# Patient Record
Sex: Male | Born: 1945
Health system: Southern US, Community
[De-identification: ages and names within clinical notes are randomized; demographics above are authoritative.]

## PROBLEM LIST (undated history)

## (undated) DIAGNOSIS — K5790 Diverticulosis of intestine, part unspecified, without perforation or abscess without bleeding: Secondary | ICD-10-CM

## (undated) DIAGNOSIS — E669 Obesity, unspecified: Secondary | ICD-10-CM

## (undated) DIAGNOSIS — I10 Essential (primary) hypertension: Secondary | ICD-10-CM

## (undated) DIAGNOSIS — N529 Male erectile dysfunction, unspecified: Secondary | ICD-10-CM

## (undated) DIAGNOSIS — E039 Hypothyroidism, unspecified: Secondary | ICD-10-CM

## (undated) DIAGNOSIS — K635 Polyp of colon: Secondary | ICD-10-CM

## (undated) HISTORY — DX: Obesity, unspecified: E66.9

## (undated) HISTORY — DX: Essential (primary) hypertension: I10

## (undated) HISTORY — DX: Polyp of colon: K63.5

## (undated) HISTORY — DX: Hypothyroidism, unspecified: E03.9

## (undated) HISTORY — PX: CATARACT EXTRACTION, BILATERAL: SHX1313

## (undated) HISTORY — DX: Diverticulosis of intestine, part unspecified, without perforation or abscess without bleeding: K57.90

## (undated) HISTORY — PX: CHOLECYSTECTOMY: SHX55

## (undated) HISTORY — DX: Male erectile dysfunction, unspecified: N52.9

---

## 2006-03-06 ENCOUNTER — Encounter: Payer: Self-pay | Admitting: Family Medicine

## 2006-03-06 LAB — CONVERTED CEMR LAB

## 2006-03-15 ENCOUNTER — Ambulatory Visit: Payer: Self-pay | Admitting: Family Medicine

## 2006-03-21 ENCOUNTER — Ambulatory Visit: Payer: Self-pay | Admitting: Family Medicine

## 2006-04-11 ENCOUNTER — Ambulatory Visit: Payer: Self-pay | Admitting: Internal Medicine

## 2006-04-27 ENCOUNTER — Ambulatory Visit: Payer: Self-pay | Admitting: Family Medicine

## 2006-06-16 ENCOUNTER — Ambulatory Visit: Payer: Self-pay | Admitting: Family Medicine

## 2006-12-28 ENCOUNTER — Ambulatory Visit: Payer: Self-pay

## 2006-12-28 ENCOUNTER — Encounter: Payer: Self-pay | Admitting: Cardiovascular Disease

## 2007-01-26 ENCOUNTER — Ambulatory Visit: Payer: Self-pay | Admitting: Family Medicine

## 2007-02-13 ENCOUNTER — Ambulatory Visit: Payer: Self-pay | Admitting: Family Medicine

## 2007-08-16 ENCOUNTER — Encounter: Payer: Self-pay | Admitting: Family Medicine

## 2007-08-16 DIAGNOSIS — K219 Gastro-esophageal reflux disease without esophagitis: Secondary | ICD-10-CM | POA: Insufficient documentation

## 2007-08-16 DIAGNOSIS — E669 Obesity, unspecified: Secondary | ICD-10-CM | POA: Insufficient documentation

## 2007-08-16 DIAGNOSIS — I10 Essential (primary) hypertension: Secondary | ICD-10-CM | POA: Insufficient documentation

## 2008-02-27 ENCOUNTER — Ambulatory Visit: Payer: Self-pay | Admitting: Family Medicine

## 2008-02-27 LAB — CONVERTED CEMR LAB
ALT: 40 units/L (ref 0–53)
Basophils Absolute: 0.1 10*3/uL (ref 0.0–0.1)
Basophils Relative: 1.3 % — ABNORMAL HIGH (ref 0.0–1.0)
CO2: 33 meq/L — ABNORMAL HIGH (ref 19–32)
Calcium: 9.6 mg/dL (ref 8.4–10.5)
Cholesterol: 188 mg/dL (ref 0–200)
Creatinine, Ser: 1 mg/dL (ref 0.4–1.5)
Glucose, Bld: 100 mg/dL — ABNORMAL HIGH (ref 70–99)
Glucose, Urine, Semiquant: NEGATIVE
HCT: 46.2 % (ref 39.0–52.0)
Hemoglobin: 15.8 g/dL (ref 13.0–17.0)
LDL Cholesterol: 115 mg/dL — ABNORMAL HIGH (ref 0–99)
Lymphocytes Relative: 28.6 % (ref 12.0–46.0)
MCHC: 34.2 g/dL (ref 30.0–36.0)
Monocytes Relative: 9.3 % (ref 3.0–11.0)
Neutro Abs: 3 10*3/uL (ref 1.4–7.7)
PSA: 1.39 ng/mL (ref 0.10–4.00)
RBC: 5.01 M/uL (ref 4.22–5.81)
Specific Gravity, Urine: 1.015
TSH: 3.44 microintl units/mL (ref 0.35–5.50)
Total Protein: 6.9 g/dL (ref 6.0–8.3)
Urobilinogen, UA: 1

## 2008-04-12 ENCOUNTER — Ambulatory Visit: Payer: Self-pay | Admitting: Family Medicine

## 2008-04-12 DIAGNOSIS — L290 Pruritus ani: Secondary | ICD-10-CM | POA: Insufficient documentation

## 2008-04-12 DIAGNOSIS — H9319 Tinnitus, unspecified ear: Secondary | ICD-10-CM | POA: Insufficient documentation

## 2008-05-06 ENCOUNTER — Ambulatory Visit: Payer: Self-pay | Admitting: Internal Medicine

## 2008-05-20 ENCOUNTER — Ambulatory Visit: Payer: Self-pay | Admitting: Internal Medicine

## 2008-05-20 ENCOUNTER — Encounter: Payer: Self-pay | Admitting: Internal Medicine

## 2008-05-20 LAB — HM COLONOSCOPY

## 2008-05-21 ENCOUNTER — Encounter: Payer: Self-pay | Admitting: Internal Medicine

## 2009-06-12 ENCOUNTER — Telehealth: Payer: Self-pay | Admitting: Family Medicine

## 2009-06-12 ENCOUNTER — Ambulatory Visit: Payer: Self-pay | Admitting: Internal Medicine

## 2009-06-17 LAB — CONVERTED CEMR LAB
AST: 24 units/L (ref 0–37)
Albumin: 3.8 g/dL (ref 3.5–5.2)
Basophils Absolute: 0.1 10*3/uL (ref 0.0–0.1)
Bilirubin Urine: NEGATIVE
CO2: 31 meq/L (ref 19–32)
GFR calc non Af Amer: 80.73 mL/min (ref >60)
Glucose, Bld: 94 mg/dL (ref 70–99)
HCT: 43 % (ref 39.0–52.0)
Hemoglobin, Urine: NEGATIVE
Hemoglobin: 14.9 g/dL (ref 13.0–17.0)
Ketones, ur: NEGATIVE mg/dL
Leukocytes, UA: NEGATIVE
Lymphs Abs: 2.1 10*3/uL (ref 0.7–4.0)
MCHC: 34.8 g/dL (ref 30.0–36.0)
Monocytes Relative: 10.9 % (ref 3.0–12.0)
Neutro Abs: 2.5 10*3/uL (ref 1.4–7.7)
Potassium: 3.9 meq/L (ref 3.5–5.1)
RDW: 12.1 % (ref 11.5–14.6)
Sodium: 142 meq/L (ref 135–145)
Specific Gravity, Urine: >=1.03 (ref 1.000–1.030)
TSH: 10.52 microintl units/mL — ABNORMAL HIGH (ref 0.35–5.50)
Total Protein: 7 g/dL (ref 6.0–8.3)
Urobilinogen, UA: 0.2 (ref 0.0–1.0)

## 2009-06-19 ENCOUNTER — Ambulatory Visit: Payer: Self-pay | Admitting: Family Medicine

## 2009-06-19 DIAGNOSIS — E039 Hypothyroidism, unspecified: Secondary | ICD-10-CM | POA: Insufficient documentation

## 2009-07-31 ENCOUNTER — Ambulatory Visit: Payer: Self-pay | Admitting: Family Medicine

## 2009-08-01 ENCOUNTER — Telehealth: Payer: Self-pay | Admitting: Family Medicine

## 2009-08-01 LAB — CONVERTED CEMR LAB: TSH: 5.29 microintl units/mL (ref 0.35–5.50)

## 2010-07-09 ENCOUNTER — Ambulatory Visit: Payer: Self-pay | Admitting: Family Medicine

## 2010-07-09 LAB — CONVERTED CEMR LAB
AST: 26 units/L (ref 0–37)
Albumin: 4 g/dL (ref 3.5–5.2)
Alkaline Phosphatase: 53 units/L (ref 39–117)
Basophils Absolute: 0.1 10*3/uL (ref 0.0–0.1)
Basophils Relative: 1.3 % (ref 0.0–3.0)
Bilirubin, Direct: 0.2 mg/dL (ref 0.0–0.3)
Blood in Urine, dipstick: NEGATIVE
Calcium: 9.4 mg/dL (ref 8.4–10.5)
Creatinine, Ser: 1 mg/dL (ref 0.4–1.5)
GFR calc non Af Amer: 83.33 mL/min (ref >60)
HDL: 46 mg/dL (ref >39.00)
Hemoglobin: 15.4 g/dL (ref 13.0–17.0)
LDL Cholesterol: 99 mg/dL (ref 0–99)
Lymphocytes Relative: 35.3 % (ref 12.0–46.0)
Monocytes Relative: 10.2 % (ref 3.0–12.0)
Neutro Abs: 2.6 10*3/uL (ref 1.4–7.7)
Neutrophils Relative %: 47.7 % (ref 43.0–77.0)
Nitrite: NEGATIVE
Protein, U semiquant: NEGATIVE
RBC: 4.66 M/uL (ref 4.22–5.81)
RDW: 12.9 % (ref 11.5–14.6)
Sodium: 139 meq/L (ref 135–145)
Total CHOL/HDL Ratio: 4
Triglycerides: 108 mg/dL (ref 0.0–149.0)
Urobilinogen, UA: 0.2
VLDL: 21.6 mg/dL (ref 0.0–40.0)
WBC Urine, dipstick: NEGATIVE

## 2010-08-03 ENCOUNTER — Ambulatory Visit: Payer: Self-pay | Admitting: Family Medicine

## 2010-08-03 DIAGNOSIS — R6882 Decreased libido: Secondary | ICD-10-CM | POA: Insufficient documentation

## 2010-08-03 LAB — CONVERTED CEMR LAB
Folate: 13.5 ng/mL
Vitamin B-12: 606 pg/mL (ref 211–911)

## 2011-01-05 NOTE — Assessment & Plan Note (Signed)
Summary: CPX // RS----PT [facility] // RS   Vital Signs:  Patient profile:   65 year old male Height:      70.5 inches Weight:      238 pounds BMI:     33.79 Temp:     98.2 degrees F oral BP sitting:   120 / 84  (left arm) Cuff size:   regular  Vitals Entered By: Kern Reap CMA Duncan Dull) (August 03, 2010 10:40 AM) CC: cpx Is Patient Diabetic? No Pain Assessment Patient in pain? no        CC:  cpx.  History of Present Illness: Edward Miles is a 65 year old male, who comes in today for general physical examination and to discuss hypertension, hypothyroidism, decreased libido.  His hypertension is treated with Tenoretic 50 -- 25 dose one half tab daily and 81-mg baby aspirin.  BP 120/84.  His hypothyroidism is treated with Synthroid 50 micrograms daily.  Will check TSH level.  He does have a history of eczema for which he uses Lidex .025 cream p.r.n.  His wife has commented that his libido is low.  We discussed the differential diagnosis.  He would like a testosterone level and to start on V........Marland Kitchen  If this does not resolve the issue then would consider changing his beta-blocker to another medication.  Tetanus booster 2007, yearly eye exam, normal dental exams, colonoscopy 3 years ago, one polyp recall 3 years.  He and his wife continue to care for their grand child.  Nightly.  Her daughter works EMS at night, and a Biomedical engineer and then take her to school in the morning than mamma picks her up after school  Allergies: No Known Drug Allergies  Past History:  Past medical, surgical, family and social histories (including risk factors) reviewed, and no changes noted (except as noted below).  Past Medical History: Reviewed history from 08/16/2007 and no changes required. GERD Hypertension obese  Past Surgical History: Reviewed history from 08/16/2007 and no changes required. Cholecystectomy  Family History: Reviewed history from 04/12/2008 and no changes required.  doesn't know  anything about his father's healthmother died of congestive heart failure, underlying hypertension no brothers one sister had Hodgkin's lymphoma  Social History: Reviewed history from 04/12/2008 and no changes required. Occupation: Married Never Smoked Alcohol use-no Drug use-no Regular exercise-no  Review of Systems      See HPI  Physical Exam  General:  Well-developed,well-nourished,in no acute distress; alert,appropriate and cooperative throughout examination Head:  Normocephalic and atraumatic without obvious abnormalities. No apparent alopecia or balding. Eyes:  No corneal or conjunctival inflammation noted. EOMI. Perrla. Funduscopic exam benign, without hemorrhages, exudates or papilledema. Vision grossly normal. Ears:  External ear exam shows no significant lesions or deformities.  Otoscopic examination reveals clear canals, tympanic membranes are intact bilaterally without bulging, retraction, inflammation or discharge. Hearing is grossly normal bilaterally. Nose:  External nasal examination shows no deformity or inflammation. Nasal mucosa are pink and moist without lesions or exudates. Mouth:  Oral mucosa and oropharynx without lesions or exudates.  Teeth in good repair. Neck:  No deformities, masses, or tenderness noted. Chest Wall:  No deformities, masses, tenderness or gynecomastia noted. Breasts:  No masses or gynecomastia noted Lungs:  Normal respiratory effort, chest expands symmetrically. Lungs are clear to auscultation, no crackles or wheezes. Heart:  Normal rate and regular rhythm. S1 and S2 normal without gallop, murmur, click, rub or other extra sounds. Abdomen:  Bowel sounds positive,abdomen soft and non-tender without masses, organomegaly or hernias noted. Rectal:  No external abnormalities noted. Normal sphincter tone. No rectal masses or tenderness. Genitalia:  Testes bilaterally descended without nodularity, tenderness or masses. No scrotal masses or lesions.  No penis lesions or urethral discharge. Prostate:  Prostate gland firm and smooth, no enlargement, nodularity, tenderness, mass, asymmetry or induration. Msk:  No deformity or scoliosis noted of thoracic or lumbar spine.   Pulses:  R and L carotid,radial,femoral,dorsalis pedis and posterior tibial pulses are full and equal bilaterally Extremities:  No clubbing, cyanosis, edema, or deformity noted with normal full range of motion of all joints.   Neurologic:  No cranial nerve deficits noted. Station and gait are normal. Plantar reflexes are down-going bilaterally. DTRs are symmetrical throughout. Sensory, motor and coordinative functions appear intact. Skin:  Intact without suspicious lesions or rashes Cervical Nodes:  No lymphadenopathy noted Axillary Nodes:  No palpable lymphadenopathy Inguinal Nodes:  No significant adenopathy Psych:  Cognition and judgment appear intact. Alert and cooperative with normal attention span and concentration. No apparent delusions, illusions, hallucinations   Impression & Recommendations:  Problem # 1:  UNSPECIFIED HYPOTHYROIDISM (ICD-244.9) Assessment Improved  His updated medication list for this problem includes:    Synthroid 50 Mcg Tabs (Levothyroxine sodium) .Marland Kitchen... Take 1 tablet by mouth every morning  Problem # 2:  OBESITY (ICD-278.00) Assessment: Unchanged  Orders: EKG w/ Interpretation (93000)  Problem # 3:  HYPERTENSION (ICD-401.9) Assessment: Improved  His updated medication list for this problem includes:    Tenoretic 50 50-25 Mg Tabs (Atenolol-chlorthalidone) .Marland Kitchen... 1/2 daily  Orders: EKG w/ Interpretation (93000)  Problem # 4:  LIBIDO, DECREASED (ICD-799.81) Assessment: New  Orders: Venipuncture (54098) TLB-B12 + Folate Pnl (11914_78295-A21/HYQ) TLB-Testosterone, Total (84403-TESTO) Specimen Handling (65784)  Complete Medication List: 1)  Aspirin 81 Mg Tbec (Aspirin) .... Once daily 2)  Tenoretic 50 50-25 Mg Tabs  (Atenolol-chlorthalidone) .... 1/2 daily 3)  Lidex 0.05 % Crea (Fluocinonide) .... Apply at bedtime 4)  Synthroid 50 Mcg Tabs (Levothyroxine sodium) .... Take 1 tablet by mouth every morning 5)  Viagra 50 Mg Tabs (Sildenafil citrate) .... Uad  Other Orders: Admin 1st Vaccine (69629) Flu Vaccine 75yrs + [phone])  Patient Instructions: 1)  I will call you when I get your lab work back. 2)  In the meantime, Viagra 50 mg directions one half tab with water, one to two hours prior to sex. 3)  Please schedule a follow-up appointment in 1 year. 4)  Please schedule a follow-up appointment as needed. 5)  It is important that you exercise regularly at least 20 minutes 5 times a week. If you develop chest pain, have severe difficulty breathing, or feel very tired , stop exercising immediately and seek medical attention. 6)  Schedule a colonoscopy/sigmoidoscopy to help detect colon cancer. 7)  Take an Aspirin every day. Prescriptions: SYNTHROID 50 MCG TABS (LEVOTHYROXINE SODIUM) Take 1 tablet by mouth every morning  #100 x 3   Entered and Authorized by:   Roderick Pee MD   Signed by:   Roderick Pee MD on 08/03/2010   Method used:   Electronically to        [id number]* (retail)       [address]       [place], [place]  [address]       Ph: [phone]       Fax: [phone]   RxID:   [id number] LIDEX 0.05 %  CREA (FLUOCINONIDE) apply at bedtime  #  60 gr x 4   Entered and Authorized by:   Roderick Pee MD   Signed by:   Roderick Pee MD on 08/03/2010   Method used:   Electronically to        [id number]* (retail)       [address]       [place], [place]  [address]       Ph: [phone]       Fax: [phone]   RxID:   [id number] TENORETIC 50 50-25 MG  TABS (ATENOLOL-CHLORTHALIDONE) 1/2 daily  #100 Each x 2   Entered and Authorized by:   Roderick Pee MD   Signed by:    Roderick Pee MD on 08/03/2010   Method used:   Electronically to        [id number]* (retail)       [address]       [place], [place]  [address]       Ph: [phone]       Fax: [phone]   RxID:   [id number] VIAGRA 50 MG TABS (SILDENAFIL CITRATE) UAD  #6 x 11   Entered and Authorized by:   Roderick Pee MD   Signed by:   Roderick Pee MD on 08/03/2010   Method used:   Electronically to        [id number]* (retail)       [address]       [place], [place]  [address]       Ph: [phone]       Fax: [phone]   RxID:   [id number]  Flu Vaccine Consent Questions     Do you have a history of severe allergic reactions to this vaccine? no    Any prior history of allergic reactions to egg and/or gelatin? no    Do you have a sensitivity to the preservative Thimersol? no    Do you have a past history of Guillan-Barre Syndrome? no    Do you currently have an acute febrile illness? no    Have you ever had a severe reaction to latex? no    Vaccine information given and explained to patient? yes    Are you currently pregnant? no    Lot Number:[id number]   Exp Date:06/05/2011   Site Given  Left Deltoid IM       [place], [place]  [address]       Ph: [phone]       Fax: [phone]   RxID:   [id number]  .lbflu

## 2011-07-22 ENCOUNTER — Telehealth: Payer: Self-pay | Admitting: Family Medicine

## 2011-07-22 MED ORDER — LEVOTHYROXINE SODIUM 50 MCG PO TABS: 50.0000 ug | ORAL_TABLET | Freq: Every day | ORAL | Status: DC

## 2011-09-06 ENCOUNTER — Other Ambulatory Visit (INDEPENDENT_AMBULATORY_CARE_PROVIDER_SITE_OTHER): Payer: BC Managed Care – PPO

## 2011-09-06 DIAGNOSIS — Z0000 Encounter for general adult medical examination without abnormal findings: Secondary | ICD-10-CM

## 2011-09-06 LAB — CBC WITH DIFFERENTIAL/PLATELET
Basophils Absolute: 0 10*3/uL (ref 0.0–0.1)
Eosinophils Relative: 3.9 % (ref 0.0–5.0)
Hemoglobin: 15.1 g/dL (ref 13.0–17.0)
Lymphocytes Relative: 31.5 % (ref 12.0–46.0)
Monocytes Relative: 10.3 % (ref 3.0–12.0)
Platelets: 170 10*3/uL (ref 150.0–400.0)
RDW: 13 % (ref 11.5–14.6)
WBC: 5.1 10*3/uL (ref 4.5–10.5)

## 2011-09-06 LAB — HEPATIC FUNCTION PANEL
ALT: 31 U/L (ref 0–53)
Albumin: 4.2 g/dL (ref 3.5–5.2)
Total Protein: 7 g/dL (ref 6.0–8.3)

## 2011-09-06 LAB — POCT URINALYSIS DIPSTICK
Bilirubin, UA: NEGATIVE
Ketones, UA: NEGATIVE
Leukocytes, UA: NEGATIVE
Spec Grav, UA: 1.02
pH, UA: 8.5

## 2011-09-06 LAB — BASIC METABOLIC PANEL
BUN: 19 mg/dL (ref 6–23)
Calcium: 9.1 mg/dL (ref 8.4–10.5)
GFR: 95.38 mL/min (ref >60.00)
Glucose, Bld: 94 mg/dL (ref 70–99)
Potassium: 4.1 mEq/L (ref 3.5–5.1)
Sodium: 139 mEq/L (ref 135–145)

## 2011-09-06 LAB — LIPID PANEL
Cholesterol: 170 mg/dL (ref 0–200)
HDL: 58.2 mg/dL (ref >39.00)
LDL Cholesterol: 95 mg/dL (ref 0–99)
VLDL: 17 mg/dL (ref 0.0–40.0)

## 2011-09-07 ENCOUNTER — Other Ambulatory Visit: Payer: Self-pay

## 2011-09-14 ENCOUNTER — Encounter: Payer: Self-pay | Admitting: Family Medicine

## 2011-09-14 ENCOUNTER — Ambulatory Visit (INDEPENDENT_AMBULATORY_CARE_PROVIDER_SITE_OTHER): Payer: BC Managed Care – PPO | Admitting: Family Medicine

## 2011-09-14 DIAGNOSIS — Z23 Encounter for immunization: Secondary | ICD-10-CM

## 2011-09-14 DIAGNOSIS — I10 Essential (primary) hypertension: Secondary | ICD-10-CM

## 2011-09-14 DIAGNOSIS — Z0000 Encounter for general adult medical examination without abnormal findings: Secondary | ICD-10-CM

## 2011-09-14 DIAGNOSIS — E039 Hypothyroidism, unspecified: Secondary | ICD-10-CM

## 2011-09-14 MED ORDER — LEVOTHYROXINE SODIUM 50 MCG PO TABS: 50.0000 ug | ORAL_TABLET | Freq: Every day | ORAL | Status: DC

## 2011-09-14 NOTE — Patient Instructions (Signed)
Continue your current medications.  Follow-up in one year, sooner if any problems 

## 2011-09-14 NOTE — Progress Notes (Signed)
  Subjective:    Patient ID: Edward Miles, male    DOB: 06-Apr-1946, 65 y.o.   MRN: [id number]  HPI This is a 65 year old, married male, nonsmoker, who comes in today for general physical examination because of a history of hypertension, eczema, and hypothyroidism.  His medications are unchanged.  He feels well and has no complaints.  He's not had to use the Viagra   Review of Systems  Constitutional: Negative.   HENT: Negative.   Eyes: Negative.   Respiratory: Negative.   Cardiovascular: Negative.   Gastrointestinal: Negative.   Genitourinary: Negative.   Musculoskeletal: Negative.   Skin: Negative.   Neurological: Negative.   Hematological: Negative.   Psychiatric/Behavioral: Negative.        Objective:   Physical Exam  Constitutional: He is oriented to person, place, and time. He appears well-developed and well-nourished.  HENT:  Head: Normocephalic and atraumatic.  Right Ear: External ear normal.  Left Ear: External ear normal.  Nose: Nose normal.  Mouth/Throat: Oropharynx is clear and moist.  Eyes: Conjunctivae and EOM are normal. Pupils are equal, round, and reactive to light.  Neck: Normal range of motion. Neck supple. No JVD present. No tracheal deviation present. No thyromegaly present.  Cardiovascular: Normal rate, regular rhythm, normal heart sounds and intact distal pulses.  Exam reveals no gallop and no friction rub.   No murmur heard. Pulmonary/Chest: Effort normal and breath sounds normal. No stridor. No respiratory distress. He has no wheezes. He has no rales. He exhibits no tenderness.  Abdominal: Soft. Bowel sounds are normal. He exhibits no distension and no mass. There is no tenderness. There is no rebound and no guarding.  Genitourinary: Rectum normal, prostate normal and penis normal. Guaiac negative stool. No penile tenderness.  Musculoskeletal: Normal range of motion. He exhibits no edema and no tenderness.  Lymphadenopathy:    He has no  cervical adenopathy.  Neurological: He is alert and oriented to person, place, and time. He has normal reflexes. No cranial nerve deficit. He exhibits normal muscle tone.  Skin: Skin is warm and dry. No rash noted. No erythema. No pallor.  Psychiatric: He has a normal mood and affect. His behavior is normal. Judgment and thought content normal.          Assessment & Plan:  Healthy male.  Hypertension.  Continue Tenoretic one half tab daily and an aspirin tablet.  Eczema.  Continue Lidex p.r.n.  Hypothyroidism.  Continue Synthroid 50 mcg daily

## 2011-09-16 ENCOUNTER — Telehealth: Payer: Self-pay

## 2011-09-16 MED ORDER — ATENOLOL-CHLORTHALIDONE 50-25 MG PO TABS: ORAL_TABLET | ORAL | Status: DC

## 2011-09-16 MED ORDER — FLUOCINONIDE 0.05 % EX CREA: 1.0000 "application " | TOPICAL_CREAM | Freq: Every day | CUTANEOUS | Status: DC

## 2011-09-16 NOTE — Telephone Encounter (Signed)
rx sent in to pharmacy.  Ok per Dr. Tawanna Cooler

## 2012-09-17 ENCOUNTER — Other Ambulatory Visit: Payer: Self-pay | Admitting: Family Medicine

## 2012-11-10 ENCOUNTER — Other Ambulatory Visit (INDEPENDENT_AMBULATORY_CARE_PROVIDER_SITE_OTHER): Payer: BC Managed Care – PPO

## 2012-11-10 DIAGNOSIS — Z0000 Encounter for general adult medical examination without abnormal findings: Secondary | ICD-10-CM

## 2012-11-10 LAB — BASIC METABOLIC PANEL
Calcium: 9.2 mg/dL (ref 8.4–10.5)
GFR: 91.34 mL/min (ref >60.00)
Glucose, Bld: 99 mg/dL (ref 70–99)
Sodium: 138 mEq/L (ref 135–145)

## 2012-11-10 LAB — CBC WITH DIFFERENTIAL/PLATELET
Basophils Absolute: 0.1 10*3/uL (ref 0.0–0.1)
HCT: 43.9 % (ref 39.0–52.0)
Lymphs Abs: 1.7 10*3/uL (ref 0.7–4.0)
MCHC: 34.3 g/dL (ref 30.0–36.0)
MCV: 92.9 fl (ref 78.0–100.0)
Monocytes Absolute: 0.5 10*3/uL (ref 0.1–1.0)
Platelets: 174 10*3/uL (ref 150.0–400.0)
RDW: 12.7 % (ref 11.5–14.6)

## 2012-11-10 LAB — POCT URINALYSIS DIPSTICK
Bilirubin, UA: NEGATIVE
Ketones, UA: NEGATIVE
Leukocytes, UA: NEGATIVE
pH, UA: 7

## 2012-11-10 LAB — LIPID PANEL
HDL: 57.3 mg/dL (ref >39.00)
Triglycerides: 115 mg/dL (ref 0.0–149.0)

## 2012-11-10 LAB — TSH: TSH: 3.29 u[IU]/mL (ref 0.35–5.50)

## 2012-11-10 LAB — HEPATIC FUNCTION PANEL
ALT: 29 U/L (ref 0–53)
AST: 23 U/L (ref 0–37)
Bilirubin, Direct: 0.2 mg/dL (ref 0.0–0.3)
Total Bilirubin: 1 mg/dL (ref 0.3–1.2)
Total Protein: 6.9 g/dL (ref 6.0–8.3)

## 2012-11-20 ENCOUNTER — Encounter: Payer: Self-pay | Admitting: Family Medicine

## 2012-11-20 ENCOUNTER — Ambulatory Visit (INDEPENDENT_AMBULATORY_CARE_PROVIDER_SITE_OTHER): Payer: BC Managed Care – PPO | Admitting: Family Medicine

## 2012-11-20 VITALS — BP 110/80 | Temp 98.1°F | Ht 71.0 in | Wt 239.0 lb

## 2012-11-20 DIAGNOSIS — Z23 Encounter for immunization: Secondary | ICD-10-CM

## 2012-11-20 DIAGNOSIS — I10 Essential (primary) hypertension: Secondary | ICD-10-CM

## 2012-11-20 DIAGNOSIS — E039 Hypothyroidism, unspecified: Secondary | ICD-10-CM

## 2012-11-20 MED ORDER — ATENOLOL-CHLORTHALIDONE 50-25 MG PO TABS: ORAL_TABLET | ORAL | Status: DC

## 2012-11-20 MED ORDER — LEVOTHYROXINE SODIUM 50 MCG PO TABS: 50.0000 ug | ORAL_TABLET | Freq: Every day | ORAL | Status: DC

## 2012-11-20 NOTE — Patient Instructions (Signed)
Continue your good health habits  Remember to walk daily  Followup in 1 year sooner if any problems

## 2012-11-20 NOTE — Progress Notes (Signed)
  Subjective:    Patient ID: Edward Miles, male    DOB: June 21, 1946, 66 y.o.   MRN: [id number]  HPI Edward Miles is a 66 year old married male nonsmoker who comes in today for general physical examination because of a history of hypertension, eczema, and hypothyroidism  He takes Tenoretic 50-25 dose one half tab daily BP 110/80  He takes Synthroid one daily for hypothyroidism TSH normal  He gets routine eye care, dental care, colonoscopy about 6 years ago he was totally polyp. Asked him to call GI since they haven't called him to do fine when he needs to come back for followup  Tetanus 2007, shingles 2012,   Review of Systems  Constitutional: Negative.   HENT: Negative.   Eyes: Negative.   Respiratory: Negative.   Cardiovascular: Negative.   Gastrointestinal: Negative.   Genitourinary: Negative.   Musculoskeletal: Negative.   Skin: Negative.   Neurological: Negative.   Hematological: Negative.   Psychiatric/Behavioral: Negative.        Objective:   Physical Exam  Constitutional: He is oriented to person, place, and time. He appears well-developed and well-nourished.  HENT:  Head: Normocephalic and atraumatic.  Right Ear: External ear normal.  Left Ear: External ear normal.  Nose: Nose normal.  Mouth/Throat: Oropharynx is clear and moist.  Eyes: Conjunctivae normal and EOM are normal. Pupils are equal, round, and reactive to light.  Neck: Normal range of motion. Neck supple. No JVD present. No tracheal deviation present. No thyromegaly present.  Cardiovascular: Normal rate, regular rhythm, normal heart sounds and intact distal pulses.  Exam reveals no gallop and no friction rub.   No murmur heard. Pulmonary/Chest: Effort normal and breath sounds normal. No stridor. No respiratory distress. He has no wheezes. He has no rales. He exhibits no tenderness.  Abdominal: Soft. Bowel sounds are normal. He exhibits no distension and no mass. There is no tenderness. There is no rebound  and no guarding.  Genitourinary: Rectum normal, prostate normal and penis normal. Guaiac negative stool. No penile tenderness.  Musculoskeletal: Normal range of motion. He exhibits no edema and no tenderness.  Lymphadenopathy:    He has no cervical adenopathy.  Neurological: He is alert and oriented to person, place, and time. He has normal reflexes. No cranial nerve deficit. He exhibits normal muscle tone.  Skin: Skin is warm and dry. No rash noted. No erythema. No pallor.  Psychiatric: He has a normal mood and affect. His behavior is normal. Judgment and thought content normal.          Assessment & Plan:  Healthy male  Hypertension continue Tenoretic dose one half tab daily  Hypothyroidism continue Synthroid 50 mcg daily  Return in one year to sooner if any problems flu shot and cardiogram today

## 2012-11-21 DIAGNOSIS — Z23 Encounter for immunization: Secondary | ICD-10-CM

## 2013-01-10 ENCOUNTER — Other Ambulatory Visit: Payer: Self-pay | Admitting: *Deleted

## 2013-01-10 MED ORDER — SILDENAFIL CITRATE 50 MG PO TABS: 50.0000 mg | ORAL_TABLET | Freq: Every day | ORAL | Status: DC | PRN

## 2013-06-20 ENCOUNTER — Encounter: Payer: Self-pay | Admitting: Internal Medicine

## 2013-06-28 ENCOUNTER — Other Ambulatory Visit: Payer: Self-pay

## 2013-06-28 DIAGNOSIS — I10 Essential (primary) hypertension: Secondary | ICD-10-CM

## 2013-06-28 DIAGNOSIS — E039 Hypothyroidism, unspecified: Secondary | ICD-10-CM

## 2013-06-28 MED ORDER — LEVOTHYROXINE SODIUM 50 MCG PO TABS: 50.0000 ug | ORAL_TABLET | Freq: Every day | ORAL | Status: DC

## 2013-06-28 MED ORDER — ATENOLOL-CHLORTHALIDONE 50-25 MG PO TABS: ORAL_TABLET | ORAL | Status: DC

## 2013-06-28 NOTE — Telephone Encounter (Signed)
Rx request for levothyroixne and tenolol/chlor 50/25. Rx sent to [facility].

## 2013-07-25 ENCOUNTER — Encounter: Payer: Self-pay | Admitting: Internal Medicine

## 2013-07-25 ENCOUNTER — Ambulatory Visit (INDEPENDENT_AMBULATORY_CARE_PROVIDER_SITE_OTHER): Payer: Medicare Other | Admitting: Internal Medicine

## 2013-07-25 VITALS — BP 140/80 | HR 97 | Temp 98.7°F | Wt 246.0 lb

## 2013-07-25 DIAGNOSIS — I10 Essential (primary) hypertension: Secondary | ICD-10-CM

## 2013-07-25 DIAGNOSIS — E039 Hypothyroidism, unspecified: Secondary | ICD-10-CM

## 2013-07-25 NOTE — Progress Notes (Signed)
  Subjective:    Patient ID: Edward Miles, male    DOB: [birth year], 67 y.o.   MRN: [id number]  HPI  Wt Readings from Last 3 Encounters:  07/25/13 246 lb (111.585 kg)  11/20/12 239 lb (108.41 kg)  09/14/11 227 lb (102.967 kg)    Review of Systems     Objective:   Physical Exam        Assessment & Plan:

## 2013-07-25 NOTE — Patient Instructions (Signed)
Call or return to clinic prn if these symptoms worsen or fail to improve as anticipated.

## 2013-07-25 NOTE — Progress Notes (Signed)
Subjective:    Patient ID: Edward Miles, male    DOB: 1946/07/10, 67 y.o.   MRN: [id number]  HPI   67 year old patient who has hypertension and treated hypothyroidism. He presents with a three-day history of a "lump in his throat". He describes a vague sense of fullness in his throat region. He states this is most noticeable he swallows but he denies any true solid food dysphagia.  No weight loss or other constitutional complaints.   lifelong nonsmoker  Past Medical History  Diagnosis Date  . GERD (gastroesophageal reflux disease)   . Hypertension   . Obese     History   Social History  . Marital Status: Married    Spouse Name: N/A    Number of Children: N/A  . Years of Education: N/A   Occupational History  . Not on file.   Social History Main Topics  . Smoking status: Never Smoker   . Smokeless tobacco: Never Used  . Alcohol Use: No  . Drug Use: No  . Sexual Activity: Not on file   Other Topics Concern  . Not on file   Social History Narrative  . No narrative on file    Past Surgical History  Procedure Laterality Date  . Cholecystectomy      Family History  Problem Relation Age of Onset  . Heart failure Mother   . Hypertension Mother   . Hodgkin's lymphoma Sister     No Known Allergies  Current Outpatient Prescriptions on File Prior to Visit  Medication Sig Dispense Refill  . aspirin 81 MG tablet Take 81 mg by mouth daily.        Marland Kitchen atenolol-chlorthalidone (TENORETIC) 50-25 MG per tablet Take 1/2 tablet daily.  100 tablet  1  . fluocinonide (LIDEX) 0.05 % cream Apply 1 application topically at bedtime.  60 g  4  . levothyroxine (SYNTHROID, LEVOTHROID) 50 MCG tablet Take 1 tablet (50 mcg total) by mouth daily.  100 tablet  1  . sildenafil (VIAGRA) 50 MG tablet Take 1 tablet (50 mg total) by mouth daily as needed for erectile dysfunction.  6 tablet  5   No current facility-administered medications on file prior to visit.    BP 140/80  Pulse 97   Temp(Src) 98.7 F (37.1 C) (Oral)  Wt 246 lb (111.585 kg)  BMI 34.33 kg/m2  SpO2 97%     Review of Systems  Constitutional: Negative for fever, chills, appetite change and fatigue.  HENT: Negative for hearing loss, ear pain, congestion, sore throat, trouble swallowing, neck stiffness, dental problem, voice change and tinnitus.   Eyes: Negative for pain, discharge and visual disturbance.  Respiratory: Negative for cough, chest tightness, wheezing and stridor.   Cardiovascular: Negative for chest pain, palpitations and leg swelling.  Gastrointestinal: Negative for nausea, vomiting, abdominal pain, diarrhea, constipation, blood in stool and abdominal distention.  Genitourinary: Negative for urgency, hematuria, flank pain, discharge, difficulty urinating and genital sores.  Musculoskeletal: Negative for myalgias, back pain, joint swelling, arthralgias and gait problem.  Skin: Negative for rash.  Neurological: Negative for dizziness, syncope, speech difficulty, weakness, numbness and headaches.  Hematological: Negative for adenopathy. Does not bruise/bleed easily.  Psychiatric/Behavioral: Negative for behavioral problems and dysphoric mood. The patient is not nervous/anxious.        Objective:   Physical Exam  Constitutional: He appears well-developed and well-nourished.  HENT:  Head: Normocephalic.  Mouth/Throat: Oropharynx is clear and moist. No oropharyngeal exudate.  Neck: Normal range of motion.  No JVD present. No tracheal deviation present. No thyromegaly present.  Pulmonary/Chest: Breath sounds normal.  Abdominal: Soft. Bowel sounds are normal. He exhibits no distension and no mass. There is no tenderness. There is no rebound and no guarding.  Lymphadenopathy:    He has no cervical adenopathy.          Assessment & Plan:  Vague fullness in the throat. Normal clinical exam.  Symptoms have been present only for 3 days. Options discussed. We'll force fluids and treat with  mucolytic sent observe. If symptoms intensify or fail to improve we'll call the office for further evaluation Hypertension repeat blood pressure 140/80  CPX as scheduled in the fall

## 2013-11-14 ENCOUNTER — Other Ambulatory Visit (INDEPENDENT_AMBULATORY_CARE_PROVIDER_SITE_OTHER): Payer: Medicare Other

## 2013-11-14 DIAGNOSIS — Z0000 Encounter for general adult medical examination without abnormal findings: Secondary | ICD-10-CM

## 2013-11-14 DIAGNOSIS — I10 Essential (primary) hypertension: Secondary | ICD-10-CM

## 2013-11-14 DIAGNOSIS — E039 Hypothyroidism, unspecified: Secondary | ICD-10-CM

## 2013-11-14 LAB — CBC WITH DIFFERENTIAL/PLATELET
Basophils Absolute: 0.1 10*3/uL (ref 0.0–0.1)
Eosinophils Relative: 5 % (ref 0.0–5.0)
Lymphocytes Relative: 33.3 % (ref 12.0–46.0)
Lymphs Abs: 2 10*3/uL (ref 0.7–4.0)
Monocytes Relative: 10.8 % (ref 3.0–12.0)
Neutrophils Relative %: 50 % (ref 43.0–77.0)
Platelets: 173 10*3/uL (ref 150.0–400.0)
RDW: 13 % (ref 11.5–14.6)
WBC: 6 10*3/uL (ref 4.5–10.5)

## 2013-11-14 LAB — BASIC METABOLIC PANEL
BUN: 19 mg/dL (ref 6–23)
CO2: 30 mEq/L (ref 19–32)
Calcium: 9.2 mg/dL (ref 8.4–10.5)
Chloride: 100 mEq/L (ref 96–112)
Creatinine, Ser: 1.1 mg/dL (ref 0.4–1.5)
GFR: 70.56 mL/min (ref >60.00)
Glucose, Bld: 89 mg/dL (ref 70–99)
Potassium: 4.1 mEq/L (ref 3.5–5.1)
Sodium: 139 mEq/L (ref 135–145)

## 2013-11-14 LAB — LIPID PANEL
Cholesterol: 182 mg/dL (ref 0–200)
HDL: 61.6 mg/dL (ref >39.00)
LDL Cholesterol: 103 mg/dL — ABNORMAL HIGH (ref 0–99)
Total CHOL/HDL Ratio: 3
VLDL: 17.4 mg/dL (ref 0.0–40.0)

## 2013-11-14 LAB — HEPATIC FUNCTION PANEL
ALT: 27 U/L (ref 0–53)
AST: 23 U/L (ref 0–37)
Bilirubin, Direct: 0.2 mg/dL (ref 0.0–0.3)
Total Bilirubin: 1.2 mg/dL (ref 0.3–1.2)

## 2013-11-14 LAB — POCT URINALYSIS DIPSTICK
Blood, UA: NEGATIVE
Ketones, UA: NEGATIVE
Leukocytes, UA: NEGATIVE
Protein, UA: NEGATIVE
Spec Grav, UA: 1.025
Urobilinogen, UA: 0.2
pH, UA: 7

## 2013-11-14 LAB — TSH: TSH: 4.19 u[IU]/mL (ref 0.35–5.50)

## 2013-11-22 ENCOUNTER — Encounter: Payer: Self-pay | Admitting: Family Medicine

## 2013-11-22 ENCOUNTER — Ambulatory Visit (INDEPENDENT_AMBULATORY_CARE_PROVIDER_SITE_OTHER): Payer: Medicare Other | Admitting: Family Medicine

## 2013-11-22 VITALS — BP 140/90 | Temp 98.8°F | Ht 71.0 in | Wt 244.0 lb

## 2013-11-22 DIAGNOSIS — Z0000 Encounter for general adult medical examination without abnormal findings: Secondary | ICD-10-CM

## 2013-11-22 DIAGNOSIS — K219 Gastro-esophageal reflux disease without esophagitis: Secondary | ICD-10-CM

## 2013-11-22 DIAGNOSIS — E669 Obesity, unspecified: Secondary | ICD-10-CM

## 2013-11-22 DIAGNOSIS — I10 Essential (primary) hypertension: Secondary | ICD-10-CM

## 2013-11-22 DIAGNOSIS — Z23 Encounter for immunization: Secondary | ICD-10-CM

## 2013-11-22 DIAGNOSIS — E039 Hypothyroidism, unspecified: Secondary | ICD-10-CM

## 2013-11-22 MED ORDER — ATENOLOL-CHLORTHALIDONE 50-25 MG PO TABS: ORAL_TABLET | ORAL | Status: DC

## 2013-11-22 MED ORDER — SILDENAFIL CITRATE 100 MG PO TABS: 50.0000 mg | ORAL_TABLET | Freq: Every day | ORAL | Status: DC | PRN

## 2013-11-22 MED ORDER — LEVOTHYROXINE SODIUM 50 MCG PO TABS: 50.0000 ug | ORAL_TABLET | Freq: Every day | ORAL | Status: DC

## 2013-11-22 NOTE — Patient Instructions (Signed)
Continue your current medications  Increase the Viagra 50 mg daily............. one half of 100 mg pill......... [email or web address]  Prilosec 20 mg twice daily  Call GI and leave a voicemail with your doctors nourished that I would recommend you haven't upper endoscopy at the same time the doing a colonoscopy to evaluate her swallowing mechanism  Return in one year sooner if any problems

## 2013-11-22 NOTE — Progress Notes (Signed)
   Subjective:    Patient ID: Edward Miles, male    DOB: [birth year], 67 y.o.   MRN: [id number]  HPI this is a 67 year old married male nonsmoker who comes in today for a Medicare wellness examination  He takes Tenoretic one tablet daily for hypertension BP 140/90  He takes Synthroid 50 mcg daily because of a history of hypothyroidism TSH level is within normal range  He's had a history of reflux esophagitis he's tried different medicines nothing seems to help. Most recently he developed a sensation of something getting stuck in his upper esophagus however he says that food doesn't get stuck he can swallow with no difficulty it's a sensation.  He's due to have his colonoscopy therefore I recommend he get upper endoscopy at the same time  Vaccinations updated by Fleet Contras    Review of Systems  Constitutional: Negative.   HENT: Negative.   Eyes: Negative.   Respiratory: Negative.   Cardiovascular: Negative.   Gastrointestinal: Negative.   Genitourinary: Negative.   Musculoskeletal: Negative.   Skin: Negative.   Neurological: Negative.   Psychiatric/Behavioral: Negative.        Objective:   Physical Exam  Nursing note and vitals reviewed. Constitutional: He is oriented to person, place, and time. He appears well-developed and well-nourished.  HENT:  Head: Normocephalic and atraumatic.  Right Ear: External ear normal.  Left Ear: External ear normal.  Nose: Nose normal.  Mouth/Throat: Oropharynx is clear and moist.  Eyes: Conjunctivae and EOM are normal. Pupils are equal, round, and reactive to light.  Neck: Normal range of motion. Neck supple. No JVD present. No tracheal deviation present. No thyromegaly present.  Cardiovascular: Normal rate, regular rhythm, normal heart sounds and intact distal pulses.  Exam reveals no gallop and no friction rub.   No murmur heard. No carotid bruits peripheral pulses 2+ and symmetrical  Pulmonary/Chest: Effort normal and breath sounds  normal. No stridor. No respiratory distress. He has no wheezes. He has no rales. He exhibits no tenderness.  Abdominal: Soft. Bowel sounds are normal. He exhibits no distension and no mass. There is no tenderness. There is no rebound and no guarding.  Genitourinary: Rectum normal, prostate normal and penis normal. Guaiac negative stool. No penile tenderness.  Musculoskeletal: Normal range of motion. He exhibits no edema and no tenderness.  Lymphadenopathy:    He has no cervical adenopathy.  Neurological: He is alert and oriented to person, place, and time. He has normal reflexes. No cranial nerve deficit. He exhibits normal muscle tone.  Skin: Skin is warm and dry. No rash noted. No erythema. No pallor.  Psychiatric: He has a normal mood and affect. His behavior is normal. Judgment and thought content normal.          Assessment & Plan:  Healthy male  Hypertension continue Tenoretic  Hypothyroidism continue Synthroid  Erectile dysfunction continue Viagra increase dose 200 mg  Sensation of dysphasia.......... discussed dietary striction Prilosec 20 twice a day GI consult when he gets his colonoscopy recommend he have upper endoscopy at the same time

## 2013-11-22 NOTE — Addendum Note (Signed)
Addended by: Kern Reap B on: 11/22/2013 05:38 PM   Modules accepted: Orders

## 2013-11-23 ENCOUNTER — Encounter: Payer: Self-pay | Admitting: Internal Medicine

## 2013-12-17 ENCOUNTER — Encounter: Payer: Medicare Other | Admitting: Internal Medicine

## 2013-12-25 ENCOUNTER — Ambulatory Visit (INDEPENDENT_AMBULATORY_CARE_PROVIDER_SITE_OTHER): Payer: Medicare HMO | Admitting: Internal Medicine

## 2013-12-25 ENCOUNTER — Encounter: Payer: Self-pay | Admitting: Internal Medicine

## 2013-12-25 VITALS — BP 134/82 | HR 76 | Ht 71.5 in | Wt 243.0 lb

## 2013-12-25 DIAGNOSIS — K219 Gastro-esophageal reflux disease without esophagitis: Secondary | ICD-10-CM

## 2013-12-25 DIAGNOSIS — R1310 Dysphagia, unspecified: Secondary | ICD-10-CM

## 2013-12-25 DIAGNOSIS — Z86010 Personal history of colonic polyps: Secondary | ICD-10-CM

## 2013-12-25 MED ORDER — OMEPRAZOLE 40 MG PO CPDR: 40.0000 mg | DELAYED_RELEASE_CAPSULE | Freq: Every day | ORAL | Status: DC

## 2013-12-25 MED ORDER — MOVIPREP 100 G PO SOLR: 1.0000 | Freq: Once | ORAL | Status: DC

## 2013-12-25 NOTE — Progress Notes (Signed)
HISTORY OF PRESENT ILLNESS:  Edward Miles is a 68 y.o. male with hypertension, hypothyroidism, GERD, and colon polyps. Patient presents today regarding "feeling of lump in the throat" and surveillance colonoscopy. The patient last underwent colonoscopy for initial routine screening 05/20/2008. In addition to mild sigmoid diverticulosis he was found to have a 5 mm adenomatous appearing descending colon polyp. This was removed. Formal pathology report revealed vegetable material. Based on the endoscopic appearance, five-year followup recommended. Patient reports developing globus type sensation around September 2014. No esophageal dysphagia or difficulty with swallowing. Does have a history of GERD symptoms with dietary indiscretion. He was seen by Dr. Sherren Mocha in December. Comprehensive blood work at that time was normal. He was placed on omeprazole 20 mg twice a day. His classic reflux symptoms improved. His globus sensation improved about 20-50%. Patient also has had increased belching. He denies weight loss. Only lower GI complaint is hemorrhoidal itching which is better with steroid cream prescribed by Dr. Sherren Mocha.  REVIEW OF SYSTEMS:  All non-GI ROS negative except for sinus and allergy trouble  Past Medical History  Diagnosis Date  . GERD (gastroesophageal reflux disease)   . Hypertension   . Obese   . Hypothyroidism   . ED (erectile dysfunction)   . Diverticulosis   . Colon polyps     Past Surgical History  Procedure Laterality Date  . Cholecystectomy      Social History [name]  reports that he has never smoked. He has never used smokeless tobacco. He reports that he does not drink alcohol or use illicit drugs.  family history includes Heart failure in his mother; Hodgkin's lymphoma in his sister; Hypertension in his mother.  No Known Allergies     PHYSICAL EXAMINATION: Vital signs: BP 134/82  Pulse 76  Ht 5' 11.5" (1.816 m)  Wt 243 lb (110.224 kg)  BMI 33.42  kg/m2  Constitutional: generally well-appearing, no acute distress Psychiatric: alert and oriented x3, cooperative Eyes: extraocular movements intact, anicteric, conjunctiva pink Mouth: oral pharynx moist, no lesions Neck: supple no lymphadenopathy Cardiovascular: heart regular rate and rhythm, no murmur Lungs: clear to auscultation bilaterally Abdomen: soft, nontender, nondistended, no obvious ascites, no peritoneal signs, normal bowel sounds, no organomegaly Rectal: Deferred until colonoscopy Extremities: no lower extremity edema bilaterally Skin: no lesions on visible extremities Neuro: No focal deficits.   ASSESSMENT:  #1. Globus sensation. This on a background of GERD. Possibly GERD related. Partial improvement with PPI #2. GERD. Classic symptoms improved with PPI #3. Adenomatous-appearing colon polyp June 2009. Five-year followup recommended #4. General medical problems. Stable   PLAN:  #1. Reflux precautions #2. Prescribe omeprazole 40 mg daily. #3. Diagnostic upper endoscopy.The nature of the procedure, as well as the risks, benefits, and alternatives were carefully and thoroughly reviewed with the patient. Ample time for discussion and questions allowed. The patient understood, was satisfied, and agreed to proceed. #4. Surveillance colonoscopy. Movi prep prescribed. Patient instructed on its use.The nature of the procedure, as well as the risks, benefits, and alternatives were carefully and thoroughly reviewed with the patient. Ample time for discussion and questions allowed. The patient understood, was satisfied, and agreed to proceed.

## 2013-12-25 NOTE — Patient Instructions (Addendum)
You have been scheduled for an endoscopy and colonoscopy with propofol. Please follow the written instructions given to you at your visit today. Please pick up your prep at the pharmacy within the next 1-3 days. If you use inhalers (even only as needed), please bring them with you on the day of your procedure. Your physician has requested that you go to www.startemmi.com and enter the access code given to you at your visit today. This web site gives a general overview about your procedure. However, you should still follow specific instructions given to you by our office regarding your preparation for the procedure.  We have sent the following medications to your pharmacy for you to pick up at your convenience:  Omeprazole

## 2014-01-01 ENCOUNTER — Encounter: Payer: Self-pay | Admitting: Internal Medicine

## 2014-01-11 ENCOUNTER — Ambulatory Visit (AMBULATORY_SURGERY_CENTER): Payer: Medicare HMO | Admitting: Internal Medicine

## 2014-01-11 ENCOUNTER — Encounter: Payer: Self-pay | Admitting: Internal Medicine

## 2014-01-11 VITALS — BP 118/72 | HR 59 | Temp 98.5°F | Resp 26 | Ht 71.0 in | Wt 243.0 lb

## 2014-01-11 DIAGNOSIS — Z860100 Personal history of colon polyps, unspecified: Secondary | ICD-10-CM

## 2014-01-11 DIAGNOSIS — D126 Benign neoplasm of colon, unspecified: Secondary | ICD-10-CM

## 2014-01-11 DIAGNOSIS — K219 Gastro-esophageal reflux disease without esophagitis: Secondary | ICD-10-CM

## 2014-01-11 MED ORDER — SODIUM CHLORIDE 0.9 % IV SOLN: 500.0000 mL | INTRAVENOUS | Status: DC

## 2014-01-11 NOTE — Progress Notes (Signed)
Called to room to assist during endoscopic procedure.  Patient ID and intended procedure confirmed with present staff. Received instructions for my participation in the procedure from the performing physician.  

## 2014-01-11 NOTE — Patient Instructions (Signed)
YOU HAD AN ENDOSCOPIC PROCEDURE TODAY AT THE [facility]: Refer to the procedure report that was given to you for any specific questions about what was found during the examination.  If the procedure report does not answer your questions, please call your gastroenterologist to clarify.  If you requested that your care partner not be given the details of your procedure findings, then the procedure report has been included in a sealed envelope for you to review at your convenience later.  YOU SHOULD EXPECT: Some feelings of bloating in the abdomen. Passage of more gas than usual.  Walking can help get rid of the air that was put into your GI tract during the procedure and reduce the bloating. If you had a lower endoscopy (such as a colonoscopy or flexible sigmoidoscopy) you may notice spotting of blood in your stool or on the toilet paper. If you underwent a bowel prep for your procedure, then you may not have a normal bowel movement for a few days.  DIET: Your first meal following the procedure should be a light meal and then it is ok to progress to your normal diet.  A half-sandwich or bowl of soup is an example of a good first meal.  Heavy or fried foods are harder to digest and may make you feel nauseous or bloated.  Likewise meals heavy in dairy and vegetables can cause extra gas to form and this can also increase the bloating.  Drink plenty of fluids but you should avoid alcoholic beverages for 24 hours.  ACTIVITY: Your care partner should take you home directly after the procedure.  You should plan to take it easy, moving slowly for the rest of the day.  You can resume normal activity the day after the procedure however you should NOT DRIVE or use heavy machinery for 24 hours (because of the sedation medicines used during the test).    SYMPTOMS TO REPORT IMMEDIATELY: A gastroenterologist can be reached at any hour.  During normal business hours, 8:30 AM to 5:00 PM Monday through Friday,  call [phone].  After hours and on weekends, please call the GI answering service at [phone] who will take a message and have the physician on call contact you.   Following lower endoscopy (colonoscopy or flexible sigmoidoscopy):  Excessive amounts of blood in the stool  Significant tenderness or worsening of abdominal pains  Swelling of the abdomen that is new, acute  Fever of 100F or higher  Following upper endoscopy (EGD)  Vomiting of blood or coffee ground material  New chest pain or pain under the shoulder blades  Painful or persistently difficult swallowing  New shortness of breath  Fever of 100F or higher  Black, tarry-looking stools  FOLLOW UP: If any biopsies were taken you will be contacted by phone or by letter within the next 1-3 weeks.  Call your gastroenterologist if you have not heard about the biopsies in 3 weeks.  Our staff will call the home number listed on your records the next business day following your procedure to check on you and address any questions or concerns that you may have at that time regarding the information given to you following your procedure. This is a courtesy call and so if there is no answer at the home number and we have not heard from you through the emergency physician on call, we will assume that you have returned to your regular daily activities without incident.  SIGNATURES/CONFIDENTIALITY: You and/or your care   partner have signed paperwork which will be entered into your electronic medical record.  These signatures attest to the fact that that the information above on your After Visit Summary has been reviewed and is understood.  Full responsibility of the confidentiality of this discharge information lies with you and/or your care-partner.   Polyps, diverticulosis, and high fiber diet information given.    Dr. Henrene Pastor will advise you about next colonoscopy after pathology reports are reviewed.  Normal EGD, continue Prilosec,   Follow-anti reflux regimen.

## 2014-01-11 NOTE — Op Note (Signed)
[place]  [address] [place], [address]   ENDOSCOPY PROCEDURE REPORT  PATIENT: Edward Miles, Edward Miles  MR#: [id number] BIRTHDATE: 23-Oct-1946 , 67  yrs. old GENDER: Male ENDOSCOPIST: Eustace Quail, MD REFERRED BY:  Dorena Cookey, M.D. PROCEDURE DATE:  01/11/2014 PROCEDURE:  EGD, diagnostic ASA CLASS:     Class II INDICATIONS:  Dysphagia.  Lane Hacker sensation MEDICATIONS: MAC sedation, administered by CRNA and propofol (Diprivan) 70mg  IV TOPICAL ANESTHETIC: Cetacaine Spray  DESCRIPTION OF PROCEDURE: After the risks benefits and alternatives of the procedure were thoroughly explained, informed consent was obtained.  The LB [id number] endoscope was introduced through the mouth and advanced to the second portion of the duodenum. Without limitations.  The instrument was slowly withdrawn as the mucosa was fully examined.      EXAM: The upper, middle and distal third of the esophagus were carefully inspected and no abnormalities were noted.  The z-line was well seen at the GEJ.  The endoscope was pushed into the fundus which was normal including a retroflexed view.  The antrum, gastric body, first and second part of the duodenum were unremarkable. Retroflexed views revealed no abnormalities.     The scope was then withdrawn from the patient and the procedure completed.  COMPLICATIONS: There were no complications. ENDOSCOPIC IMPRESSION: 1. Normal EGD  RECOMMENDATIONS: 1.  Anti-reflux regimen to be followed 2.  Continue Prilosec  REPEAT EXAM:  eSigned:  Eustace Quail, MD 01/11/2014 5:00 PM   ER:[id number] Delora Fuel, MD and The Patient

## 2014-01-11 NOTE — Op Note (Signed)
[place]  [address] [place], [address]   COLONOSCOPY PROCEDURE REPORT  PATIENT: Edward Miles, Edward Miles  MR#: [id number] BIRTHDATE: [birth year] , 67  yrs. old GENDER: Male ENDOSCOPIST: Eustace Quail, MD REFERRED [id number] Delora Fuel, M.D. PROCEDURE DATE:  01/11/2014 PROCEDURE:   Colonoscopy with snare polypectomy x1 First Screening Colonoscopy - Avg.  risk and is 50 yrs.  old or older - No.  Prior Negative Screening - Now for repeat screening. N/A  History of Adenoma - Now for follow-up colonoscopy & has been > or = to 3 yrs.  Yes hx of adenoma.  Has been 3 or more years since last colonoscopy.  Polyps Removed Today? Yes. ASA CLASS:   Class II INDICATIONS:Patient's personal history of colon polyps. index exam June 2009-small adenomatous appearing polyp MEDICATIONS: MAC sedation, administered by CRNA and propofol (Diprivan) 330mg  IV DESCRIPTION OF PROCEDURE:   After the risks benefits and alternatives of the procedure were thoroughly explained, informed consent was obtained.  A digital rectal exam revealed no abnormalities of the rectum.   The LB [id number]  endoscope was introduced through the anus and advanced to the cecum, which was identified by both the appendix and ileocecal valve. No adverse events experienced.   The quality of the prep was excellent, using MoviPrep  The instrument was then slowly withdrawn as the colon was fully examined.  COLON FINDINGS: A diminutive polyp was found in the transverse colon.  A polypectomy was performed with a cold snare.  The resection was complete and the polyp tissue was completely retrieved.   Mild diverticulosis was noted in the sigmoid colon. The colon mucosa was otherwise normal.  Retroflexed views revealed internal hemorrhoids. The time to cecum=4 minutes 14 seconds. Withdrawal time=12 minutes 25 seconds.  The scope was withdrawn and the procedure completed. COMPLICATIONS: There were no  complications.  ENDOSCOPIC IMPRESSION: 1.   Diminutive polyp was found in the transverse colon; polypectomy was performed with a cold snare 2.   Mild diverticulosis was noted in the sigmoid colon 3.   The colon mucosa was otherwise normal  RECOMMENDATIONS: 1.  Repeat colonoscopy in 5 years if polyp adenomatous; otherwise 10 years 2.  Upper endoscopy today (see report)   eSigned:  Eustace Quail, MD 01/11/2014 4:55 PM   cc: Dorena Cookey, MD and The Patient

## 2014-01-11 NOTE — Progress Notes (Signed)
Report to pacu rn, vss, bbs=clear 

## 2014-01-14 ENCOUNTER — Telehealth: Payer: Self-pay | Admitting: *Deleted

## 2014-01-14 NOTE — Telephone Encounter (Signed)
  Follow up Call-  Tried work and home number.  No answer at home and no answering machine to leave message.  Tried work number, but person answering was not the pt.  Did not leave message here.

## 2014-01-17 ENCOUNTER — Encounter: Payer: Self-pay | Admitting: Internal Medicine

## 2014-11-28 ENCOUNTER — Other Ambulatory Visit: Payer: Medicare HMO

## 2014-12-05 ENCOUNTER — Other Ambulatory Visit (INDEPENDENT_AMBULATORY_CARE_PROVIDER_SITE_OTHER): Payer: Medicare HMO

## 2014-12-05 ENCOUNTER — Encounter: Payer: Medicare HMO | Admitting: Family Medicine

## 2014-12-05 DIAGNOSIS — Z0000 Encounter for general adult medical examination without abnormal findings: Secondary | ICD-10-CM

## 2014-12-05 DIAGNOSIS — E039 Hypothyroidism, unspecified: Secondary | ICD-10-CM

## 2014-12-05 DIAGNOSIS — I10 Essential (primary) hypertension: Secondary | ICD-10-CM

## 2014-12-05 LAB — COMPREHENSIVE METABOLIC PANEL
ALT: 36 U/L (ref 0–53)
AST: 28 U/L (ref 0–37)
Albumin: 4 g/dL (ref 3.5–5.2)
Alkaline Phosphatase: 61 U/L (ref 39–117)
BUN: 20 mg/dL (ref 6–23)
CALCIUM: 9.3 mg/dL (ref 8.4–10.5)
CO2: 30 mEq/L (ref 19–32)
CREATININE: 1 mg/dL (ref 0.4–1.5)
Chloride: 102 mEq/L (ref 96–112)
GFR: 80.26 mL/min (ref >60.00)
Glucose, Bld: 101 mg/dL — ABNORMAL HIGH (ref 70–99)
Potassium: 3.9 mEq/L (ref 3.5–5.1)
Sodium: 138 mEq/L (ref 135–145)
TOTAL PROTEIN: 6.9 g/dL (ref 6.0–8.3)
Total Bilirubin: 1.1 mg/dL (ref 0.2–1.2)

## 2014-12-05 LAB — CBC WITH DIFFERENTIAL/PLATELET
BASOS ABS: 0 10*3/uL (ref 0.0–0.1)
Basophils Relative: 0.7 % (ref 0.0–3.0)
EOS ABS: 0.3 10*3/uL (ref 0.0–0.7)
Eosinophils Relative: 4.5 % (ref 0.0–5.0)
HCT: 47.1 % (ref 39.0–52.0)
HEMOGLOBIN: 16 g/dL (ref 13.0–17.0)
LYMPHS PCT: 31 % (ref 12.0–46.0)
Lymphs Abs: 2 10*3/uL (ref 0.7–4.0)
MCHC: 33.9 g/dL (ref 30.0–36.0)
MCV: 93.9 fl (ref 78.0–100.0)
MONOS PCT: 10.2 % (ref 3.0–12.0)
Monocytes Absolute: 0.7 10*3/uL (ref 0.1–1.0)
NEUTROS ABS: 3.5 10*3/uL (ref 1.4–7.7)
Neutrophils Relative %: 53.6 % (ref 43.0–77.0)
PLATELETS: 178 10*3/uL (ref 150.0–400.0)
RBC: 5.01 Mil/uL (ref 4.22–5.81)
RDW: 12.9 % (ref 11.5–15.5)
WBC: 6.5 10*3/uL (ref 4.0–10.5)

## 2014-12-05 LAB — POCT URINALYSIS DIPSTICK
Bilirubin, UA: NEGATIVE
Blood, UA: NEGATIVE
Glucose, UA: NEGATIVE
Ketones, UA: NEGATIVE
Leukocytes, UA: NEGATIVE
Nitrite, UA: NEGATIVE
PH UA: 7
SPEC GRAV UA: 1.02
Urobilinogen, UA: 1

## 2014-12-05 LAB — LIPID PANEL
CHOL/HDL RATIO: 3
CHOLESTEROL: 175 mg/dL (ref 0–200)
HDL: 53.7 mg/dL (ref >39.00)
LDL Cholesterol: 94 mg/dL (ref 0–99)
NonHDL: 121.3
Triglycerides: 136 mg/dL (ref 0.0–149.0)
VLDL: 27.2 mg/dL (ref 0.0–40.0)

## 2014-12-05 LAB — PSA: PSA: 1.62 ng/mL (ref 0.10–4.00)

## 2014-12-05 LAB — TSH: TSH: 3.75 u[IU]/mL (ref 0.35–4.50)

## 2014-12-12 ENCOUNTER — Encounter: Payer: Self-pay | Admitting: Family Medicine

## 2014-12-12 ENCOUNTER — Ambulatory Visit (INDEPENDENT_AMBULATORY_CARE_PROVIDER_SITE_OTHER): Payer: Medicare HMO | Admitting: Family Medicine

## 2014-12-12 VITALS — BP 120/84 | Temp 98.1°F | Ht 71.75 in | Wt 240.0 lb

## 2014-12-12 DIAGNOSIS — E039 Hypothyroidism, unspecified: Secondary | ICD-10-CM

## 2014-12-12 DIAGNOSIS — K2100 Gastro-esophageal reflux disease with esophagitis, without bleeding: Secondary | ICD-10-CM

## 2014-12-12 DIAGNOSIS — N529 Male erectile dysfunction, unspecified: Secondary | ICD-10-CM

## 2014-12-12 DIAGNOSIS — Z23 Encounter for immunization: Secondary | ICD-10-CM

## 2014-12-12 DIAGNOSIS — I10 Essential (primary) hypertension: Secondary | ICD-10-CM

## 2014-12-12 MED ORDER — SILDENAFIL CITRATE 100 MG PO TABS: 50.0000 mg | ORAL_TABLET | Freq: Every day | ORAL | Status: DC | PRN

## 2014-12-12 MED ORDER — ATENOLOL-CHLORTHALIDONE 50-25 MG PO TABS: ORAL_TABLET | ORAL | Status: DC

## 2014-12-12 MED ORDER — LEVOTHYROXINE SODIUM 50 MCG PO TABS: 50.0000 ug | ORAL_TABLET | Freq: Every day | ORAL | Status: DC

## 2014-12-12 MED ORDER — OMEPRAZOLE 40 MG PO CPDR
40.0000 mg | DELAYED_RELEASE_CAPSULE | Freq: Every day | ORAL | Status: DC
Start: 2014-12-12 — End: 2016-03-08

## 2014-12-12 NOTE — Progress Notes (Signed)
Pre visit review using our clinic review tool, if applicable. No additional management support is needed unless otherwise documented below in the visit note. 

## 2014-12-12 NOTE — Patient Instructions (Signed)
Continue current medications  Scheduled time next week 30 minute appointment for removal of the 2 lesions we discussed  Return in one year sooner if any problems  Walk 30 minutes daily

## 2014-12-12 NOTE — Progress Notes (Signed)
   Subjective:    Patient ID: Edward Miles, male    DOB: 01-17-1946, 69 y.o.   MRN: [id number]  HPI Edward Miles is a 69 year old married male nonsmoker who comes in today for general physical examination because of a history of underlying hypertension on Tenoretic 50-25 dose one half tab daily BP 120/84  He also takes Synthroid 50 g for hypothyroidism, Prilosec 40 mg a day for chronic reflux. He's try to wean off the Prilosec but he has more symptoms of reflux therefore has been taking every day.  He takes Viagra sparingly  He gets routine eye care, dental care, last colonoscopy 2015 showed one benign polyp  Vaccinations updated by Apolonio Schneiders  Cognitive function normal he walks on a regular basis home health safety reviewed no issues identified, no guns in the house, he does have a healthcare power of attorney and living well   Review of Systems  Constitutional: Negative.   HENT: Negative.   Eyes: Negative.   Respiratory: Negative.   Cardiovascular: Negative.   Gastrointestinal: Negative.   Endocrine: Negative.   Genitourinary: Negative.   Musculoskeletal: Negative.   Skin: Negative.   Allergic/Immunologic: Negative.   Neurological: Negative.   Hematological: Negative.   Psychiatric/Behavioral: Negative.        Objective:   Physical Exam  Constitutional: He is oriented to person, place, and time. He appears well-developed and well-nourished.  HENT:  Head: Normocephalic and atraumatic.  Right Ear: External ear normal.  Left Ear: External ear normal.  Nose: Nose normal.  Mouth/Throat: Oropharynx is clear and moist.  Eyes: Conjunctivae and EOM are normal. Pupils are equal, round, and reactive to light.  Neck: Normal range of motion. Neck supple. No JVD present. No tracheal deviation present. No thyromegaly present.  Cardiovascular: Normal rate, regular rhythm, normal heart sounds and intact distal pulses.  Exam reveals no gallop and no friction rub.   No murmur heard. No  carotid nor aortic bruits peripheral pulses 2+ and symmetrical  Pulmonary/Chest: Effort normal and breath sounds normal. No stridor. No respiratory distress. He has no wheezes. He has no rales. He exhibits no tenderness.  Abdominal: Soft. Bowel sounds are normal. He exhibits no distension and no mass. There is no tenderness. There is no rebound and no guarding.  Genitourinary: Rectum normal and penis normal. Guaiac negative stool. No penile tenderness.  1+ symmetrical nonnodular BPH nocturia 1  Musculoskeletal: Normal range of motion. He exhibits no edema or tenderness.  Lymphadenopathy:    He has no cervical adenopathy.  Neurological: He is alert and oriented to person, place, and time. He has normal reflexes. No cranial nerve deficit. He exhibits normal muscle tone.  Skin: Skin is warm and dry. No rash noted. No erythema. No pallor.  Total body skin exam normal except for a lesion on his midforehead........ appears to be an actinic keratosis....... and a lesion on his nose this been bleeding and will heal........... advised to return for removal ASAP.  Psychiatric: He has a normal mood and affect. His behavior is normal. Judgment and thought content normal.  Nursing note and vitals reviewed.         Assessment & Plan:  Healthy male  2 abnormal lesions identified on skin exam return for removal ASAP  Hypertension........... continue Tenoretic one half tab daily  History of hypothyroidism........ continue Synthroid 50 mg daily  Reflux esophagitis chronic........ Prilosec 40 mg daily  Erectile dysfunction..........Marland Kitchen Viagra when necessary  BPH with nocturia......... check PSA

## 2014-12-19 ENCOUNTER — Ambulatory Visit (INDEPENDENT_AMBULATORY_CARE_PROVIDER_SITE_OTHER): Payer: Medicare HMO | Admitting: Family Medicine

## 2014-12-19 ENCOUNTER — Encounter: Payer: Self-pay | Admitting: Family Medicine

## 2014-12-19 ENCOUNTER — Other Ambulatory Visit: Payer: Self-pay | Admitting: Family Medicine

## 2014-12-19 DIAGNOSIS — L570 Actinic keratosis: Secondary | ICD-10-CM | POA: Insufficient documentation

## 2014-12-19 DIAGNOSIS — C44311 Basal cell carcinoma of skin of nose: Secondary | ICD-10-CM

## 2014-12-19 NOTE — Patient Instructions (Signed)
Remove the Band-Aids tomorrow  Within 2 weeks we will call you the report   

## 2014-12-19 NOTE — Progress Notes (Signed)
   Subjective:    Patient ID: Edward Miles, male    DOB: 02/21/1946, 69 y.o.   MRN: [id number]  HPI Edward Miles is a 69 year old male who comes in today for removal of 2 lesions  Lesion #1 is a 6 mm die 6 mm lesion right side of the nose. It's been bleeding intermittently over the last 4 weeks. On physical exam and has a pit in the middle consistent with an early basal cell carcinoma  6 mm x 6 mm lesion left forehead consistent with a actinic keratosis  After informed consent both lesions were anesthetized with 1% Xylocaine with epinephrine and remove a 2 mm margins. The bases were cauterized dry sterile dressing was applied the lesions were sent for pathologic analysis     Review of Systems review of systems otherwise negative    Objective:   Physical Exam   well-developed well-nourished male no acute distress vital signs stable is afebrile procedure see above      Assessment & Plan:   6 mm 6 mm lesion right nose....... clinically early BCE  6 mm 6 mm lesion left forehead........ clinically actinic keratosis.......... all path pending

## 2014-12-19 NOTE — Addendum Note (Signed)
Addended by: Westley Hummer B on: 12/19/2014 12:29 PM   Modules accepted: Orders

## 2015-10-13 ENCOUNTER — Encounter: Payer: Self-pay | Admitting: Internal Medicine

## 2015-12-29 DIAGNOSIS — R69 Illness, unspecified: Secondary | ICD-10-CM | POA: Diagnosis not present

## 2016-01-17 ENCOUNTER — Other Ambulatory Visit: Payer: Self-pay | Admitting: Family Medicine

## 2016-01-19 DIAGNOSIS — R69 Illness, unspecified: Secondary | ICD-10-CM | POA: Diagnosis not present

## 2016-03-01 ENCOUNTER — Other Ambulatory Visit (INDEPENDENT_AMBULATORY_CARE_PROVIDER_SITE_OTHER): Payer: Medicare HMO

## 2016-03-01 DIAGNOSIS — Z0000 Encounter for general adult medical examination without abnormal findings: Secondary | ICD-10-CM | POA: Diagnosis not present

## 2016-03-01 DIAGNOSIS — Z125 Encounter for screening for malignant neoplasm of prostate: Secondary | ICD-10-CM | POA: Diagnosis not present

## 2016-03-01 LAB — CBC WITH DIFFERENTIAL/PLATELET
BASOS PCT: 0.9 % (ref 0.0–3.0)
Basophils Absolute: 0 10*3/uL (ref 0.0–0.1)
EOS ABS: 0.3 10*3/uL (ref 0.0–0.7)
EOS PCT: 4.8 % (ref 0.0–5.0)
HCT: 44.4 % (ref 39.0–52.0)
HEMOGLOBIN: 15.5 g/dL (ref 13.0–17.0)
LYMPHS ABS: 2.1 10*3/uL (ref 0.7–4.0)
Lymphocytes Relative: 36.5 % (ref 12.0–46.0)
MCHC: 35 g/dL (ref 30.0–36.0)
MCV: 92.4 fl (ref 78.0–100.0)
MONO ABS: 0.6 10*3/uL (ref 0.1–1.0)
Monocytes Relative: 10.2 % (ref 3.0–12.0)
NEUTROS PCT: 47.6 % (ref 43.0–77.0)
Neutro Abs: 2.7 10*3/uL (ref 1.4–7.7)
Platelets: 172 10*3/uL (ref 150.0–400.0)
RBC: 4.8 Mil/uL (ref 4.22–5.81)
RDW: 12.8 % (ref 11.5–15.5)
WBC: 5.6 10*3/uL (ref 4.0–10.5)

## 2016-03-01 LAB — BASIC METABOLIC PANEL
BUN: 20 mg/dL (ref 6–23)
CO2: 31 mEq/L (ref 19–32)
CREATININE: 0.94 mg/dL (ref 0.40–1.50)
Calcium: 9.4 mg/dL (ref 8.4–10.5)
Chloride: 103 mEq/L (ref 96–112)
GFR: 84.89 mL/min (ref >60.00)
GLUCOSE: 103 mg/dL — AB (ref 70–99)
Potassium: 4 mEq/L (ref 3.5–5.1)
Sodium: 140 mEq/L (ref 135–145)

## 2016-03-01 LAB — POC URINALSYSI DIPSTICK (AUTOMATED)
BILIRUBIN UA: NEGATIVE
Blood, UA: NEGATIVE
GLUCOSE UA: NEGATIVE
KETONES UA: NEGATIVE
LEUKOCYTES UA: NEGATIVE
Nitrite, UA: NEGATIVE
PROTEIN UA: NEGATIVE
Urobilinogen, UA: 0.2
pH, UA: 5.5

## 2016-03-01 LAB — HEPATIC FUNCTION PANEL
ALT: 24 U/L (ref 0–53)
AST: 19 U/L (ref 0–37)
Albumin: 4 g/dL (ref 3.5–5.2)
Alkaline Phosphatase: 54 U/L (ref 39–117)
BILIRUBIN DIRECT: 0.1 mg/dL (ref 0.0–0.3)
BILIRUBIN TOTAL: 0.7 mg/dL (ref 0.2–1.2)
Total Protein: 6.8 g/dL (ref 6.0–8.3)

## 2016-03-01 LAB — LIPID PANEL
CHOL/HDL RATIO: 3
CHOLESTEROL: 182 mg/dL (ref 0–200)
HDL: 62.4 mg/dL (ref >39.00)
LDL CALC: 104 mg/dL — AB (ref 0–99)
NonHDL: 119.53
TRIGLYCERIDES: 80 mg/dL (ref 0.0–149.0)
VLDL: 16 mg/dL (ref 0.0–40.0)

## 2016-03-01 LAB — TSH: TSH: 5.68 u[IU]/mL — ABNORMAL HIGH (ref 0.35–4.50)

## 2016-03-01 LAB — PSA: PSA: 1.78 ng/mL (ref 0.10–4.00)

## 2016-03-08 ENCOUNTER — Ambulatory Visit (INDEPENDENT_AMBULATORY_CARE_PROVIDER_SITE_OTHER): Payer: Medicare HMO | Admitting: Family Medicine

## 2016-03-08 ENCOUNTER — Encounter: Payer: Self-pay | Admitting: Family Medicine

## 2016-03-08 VITALS — BP 140/80 | Temp 98.2°F | Ht 71.0 in | Wt 245.0 lb

## 2016-03-08 DIAGNOSIS — Z23 Encounter for immunization: Secondary | ICD-10-CM

## 2016-03-08 DIAGNOSIS — R0989 Other specified symptoms and signs involving the circulatory and respiratory systems: Secondary | ICD-10-CM | POA: Insufficient documentation

## 2016-03-08 DIAGNOSIS — Z0000 Encounter for general adult medical examination without abnormal findings: Secondary | ICD-10-CM

## 2016-03-08 DIAGNOSIS — I10 Essential (primary) hypertension: Secondary | ICD-10-CM

## 2016-03-08 DIAGNOSIS — N529 Male erectile dysfunction, unspecified: Secondary | ICD-10-CM | POA: Diagnosis not present

## 2016-03-08 DIAGNOSIS — R351 Nocturia: Secondary | ICD-10-CM

## 2016-03-08 DIAGNOSIS — E039 Hypothyroidism, unspecified: Secondary | ICD-10-CM

## 2016-03-08 DIAGNOSIS — N401 Enlarged prostate with lower urinary tract symptoms: Secondary | ICD-10-CM

## 2016-03-08 DIAGNOSIS — E669 Obesity, unspecified: Secondary | ICD-10-CM

## 2016-03-08 MED ORDER — SILDENAFIL CITRATE 20 MG PO TABS: ORAL_TABLET | ORAL | Status: DC

## 2016-03-08 MED ORDER — ATENOLOL-CHLORTHALIDONE 50-25 MG PO TABS: 0.5000 | ORAL_TABLET | Freq: Every day | ORAL | Status: DC

## 2016-03-08 MED ORDER — LEVOTHYROXINE SODIUM 50 MCG PO TABS: 50.0000 ug | ORAL_TABLET | Freq: Every day | ORAL | Status: DC

## 2016-03-08 MED ORDER — SILDENAFIL CITRATE 100 MG PO TABS: 50.0000 mg | ORAL_TABLET | Freq: Every day | ORAL | Status: DC | PRN

## 2016-03-08 NOTE — Progress Notes (Signed)
Pre visit review using our clinic review tool, if applicable. No additional management support is needed unless otherwise documented below in the visit note. 

## 2016-03-08 NOTE — Patient Instructions (Addendum)
Continue current medications except take your thyroid and not eat for an hour,,,,,,, sometimes that works best if he does take at bedtime  Carbohydrate free diet,,,,,,,,,,, walk 30 minutes daily  Follow-up in one year sooner if any problems  When you call in November 2017 for your physical exam the first week in April 2018,,,,,,,,,,,,, Tommi Rumps or Almyra Free are 2 new adult nurse practitioner's or Dr. Martinique  We will get you set up at the cardiology office for an ultrasound of your right carotid

## 2016-03-08 NOTE — Progress Notes (Signed)
Subjective:    Patient ID: Edward Miles, male    DOB: Apr 24, 1946, 70 y.o.   MRN: [id number]  HPI Edward Miles is a 70 year old married male nonsmoker who comes in today for general physical examination because of a history of hypertension hypothyroidism erectile dysfunction  He takes Tenoretic 50-25 dose one half tab daily for hypertension BP 140/90  He takes a baby aspirin daily  Takes Synthroid 50 g daily because of hypothyroidism. His TSH level slightly elevated but is not taking it on an empty stomach.  He uses Viagra when necessary for ED  Couple years ago we heard a bruit in his right carotid ultrasound at that time was negative he's asymptomatic neurologically  He gets routine eye care, dental care, colonoscopy 2015 was normal.  Vaccinations he was given a Pneumovax 23 today by Apolonio Schneiders  Cognitive function normal he exercises sporadically recommended daily, home health safety reviewed no issues identified, no guns in the house, he does have a healthcare power of attorney and living well  Weight is up to 245 pounds BMI 32.80. He's had a history of skin cancer   Review of Systems  Constitutional: Negative.   HENT: Negative.   Eyes: Negative.   Respiratory: Negative.   Cardiovascular: Negative.   Gastrointestinal: Negative.   Endocrine: Negative.   Genitourinary: Negative.   Musculoskeletal: Negative.   Skin: Negative.   Allergic/Immunologic: Negative.   Neurological: Negative.   Hematological: Negative.   Psychiatric/Behavioral: Negative.        Objective:   Physical Exam  Constitutional: He is oriented to person, place, and time. He appears well-developed and well-nourished.  HENT:  Head: Normocephalic and atraumatic.  Right Ear: External ear normal.  Left Ear: External ear normal.  Nose: Nose normal.  Mouth/Throat: Oropharynx is clear and moist.  Eyes: Conjunctivae and EOM are normal. Pupils are equal, round, and reactive to light.  Neck: Normal range of  motion. Neck supple. No JVD present. No tracheal deviation present. No thyromegaly present.  Cardiovascular: Normal rate, regular rhythm, normal heart sounds and intact distal pulses.  Exam reveals no gallop and no friction rub.   No murmur heard. Right carotid bruit left normal  Aorta normal peripheral pulses 2+ and symmetrical  Pulmonary/Chest: Effort normal and breath sounds normal. No stridor. No respiratory distress. He has no wheezes. He has no rales. He exhibits no tenderness.  Abdominal: Soft. Bowel sounds are normal. He exhibits no distension and no mass. There is no tenderness. There is no rebound and no guarding.  Genitourinary: Rectum normal and penis normal. Guaiac negative stool. No penile tenderness.  1+ symmetrical nonnodular BPH  Musculoskeletal: Normal range of motion. He exhibits no edema or tenderness.  Lymphadenopathy:    He has no cervical adenopathy.  Neurological: He is alert and oriented to person, place, and time. He has normal reflexes. No cranial nerve deficit. He exhibits normal muscle tone.  Skin: Skin is warm and dry. No rash noted. No erythema. No pallor.  Skin exam normal except for scars from previous lesions that were removed  Psychiatric: He has a normal mood and affect. His behavior is normal. Judgment and thought content normal.  Nursing note and vitals reviewed.         Assessment & Plan:  Hypertension at goal........ continue current therapy  Hypothyroidism....... continue Synthroid 50 g daily but take on empty stomach  Erectile dysfunction...........Marland Kitchen Viagra 100 or generic Viagra 20  Right carotid bruit................ diagnostic ultrasound  Overweight with mild elevated blood  sugar 103,,,,,,,,,,,,, advised diet exercise weight loss salt free diet

## 2016-03-22 ENCOUNTER — Encounter (HOSPITAL_COMMUNITY): Payer: Medicare HMO

## 2016-03-25 ENCOUNTER — Ambulatory Visit (HOSPITAL_COMMUNITY)
Admission: RE | Admit: 2016-03-25 | Discharge: 2016-03-25 | Disposition: A | Discharge status: Home / Self Care | Payer: Medicare HMO | Source: Ambulatory Visit | Attending: Cardiology | Admitting: Cardiology

## 2016-03-25 DIAGNOSIS — Z6834 Body mass index (BMI) 34.0-34.9, adult: Secondary | ICD-10-CM | POA: Diagnosis not present

## 2016-03-25 DIAGNOSIS — K219 Gastro-esophageal reflux disease without esophagitis: Secondary | ICD-10-CM | POA: Insufficient documentation

## 2016-03-25 DIAGNOSIS — R0989 Other specified symptoms and signs involving the circulatory and respiratory systems: Secondary | ICD-10-CM | POA: Insufficient documentation

## 2016-03-25 DIAGNOSIS — I10 Essential (primary) hypertension: Secondary | ICD-10-CM | POA: Diagnosis not present

## 2016-03-25 DIAGNOSIS — E669 Obesity, unspecified: Secondary | ICD-10-CM | POA: Diagnosis not present

## 2016-04-19 ENCOUNTER — Other Ambulatory Visit: Payer: Self-pay

## 2016-07-12 DIAGNOSIS — R69 Illness, unspecified: Secondary | ICD-10-CM | POA: Diagnosis not present

## 2016-09-28 DIAGNOSIS — H524 Presbyopia: Secondary | ICD-10-CM | POA: Diagnosis not present

## 2016-09-28 DIAGNOSIS — Z0100 Encounter for examination of eyes and vision without abnormal findings: Secondary | ICD-10-CM | POA: Diagnosis not present

## 2016-09-28 DIAGNOSIS — I10 Essential (primary) hypertension: Secondary | ICD-10-CM | POA: Diagnosis not present

## 2017-01-17 DIAGNOSIS — R69 Illness, unspecified: Secondary | ICD-10-CM | POA: Diagnosis not present

## 2017-03-01 ENCOUNTER — Other Ambulatory Visit: Payer: Medicare HMO

## 2017-03-09 ENCOUNTER — Encounter: Payer: Medicare HMO | Admitting: Family Medicine

## 2017-03-10 ENCOUNTER — Other Ambulatory Visit: Payer: Medicare HMO

## 2017-03-14 ENCOUNTER — Ambulatory Visit (INDEPENDENT_AMBULATORY_CARE_PROVIDER_SITE_OTHER): Payer: Medicare HMO | Admitting: Family Medicine

## 2017-03-14 ENCOUNTER — Encounter: Payer: Self-pay | Admitting: Family Medicine

## 2017-03-14 VITALS — BP 122/80 | Temp 98.5°F | Ht 70.0 in | Wt 223.0 lb

## 2017-03-14 DIAGNOSIS — N401 Enlarged prostate with lower urinary tract symptoms: Secondary | ICD-10-CM | POA: Diagnosis not present

## 2017-03-14 DIAGNOSIS — R351 Nocturia: Secondary | ICD-10-CM | POA: Diagnosis not present

## 2017-03-14 DIAGNOSIS — Z0000 Encounter for general adult medical examination without abnormal findings: Secondary | ICD-10-CM

## 2017-03-14 DIAGNOSIS — E039 Hypothyroidism, unspecified: Secondary | ICD-10-CM | POA: Diagnosis not present

## 2017-03-14 DIAGNOSIS — Z136 Encounter for screening for cardiovascular disorders: Secondary | ICD-10-CM

## 2017-03-14 DIAGNOSIS — I10 Essential (primary) hypertension: Secondary | ICD-10-CM | POA: Diagnosis not present

## 2017-03-14 LAB — BASIC METABOLIC PANEL
BUN: 19 mg/dL (ref 6–23)
CALCIUM: 9.3 mg/dL (ref 8.4–10.5)
CHLORIDE: 101 meq/L (ref 96–112)
CO2: 33 mEq/L — ABNORMAL HIGH (ref 19–32)
CREATININE: 0.89 mg/dL (ref 0.40–1.50)
GFR: 90.14 mL/min (ref >60.00)
GLUCOSE: 85 mg/dL (ref 70–99)
Potassium: 4.1 mEq/L (ref 3.5–5.1)
SODIUM: 139 meq/L (ref 135–145)

## 2017-03-14 LAB — POCT URINALYSIS DIPSTICK
BILIRUBIN UA: NEGATIVE
GLUCOSE UA: NEGATIVE
Ketones, UA: NEGATIVE
Leukocytes, UA: NEGATIVE
Nitrite, UA: NEGATIVE
Protein, UA: NEGATIVE
RBC UA: NEGATIVE
SPEC GRAV UA: 1.03 (ref 1.030–1.035)
Urobilinogen, UA: 0.2 (ref ?–2.0)
pH, UA: 6 (ref 5.0–8.0)

## 2017-03-14 LAB — HEPATIC FUNCTION PANEL
ALK PHOS: 64 U/L (ref 39–117)
ALT: 31 U/L (ref 0–53)
AST: 25 U/L (ref 0–37)
Albumin: 4.1 g/dL (ref 3.5–5.2)
Bilirubin, Direct: 0.1 mg/dL (ref 0.0–0.3)
Total Bilirubin: 0.6 mg/dL (ref 0.2–1.2)
Total Protein: 6.8 g/dL (ref 6.0–8.3)

## 2017-03-14 LAB — LIPID PANEL
CHOL/HDL RATIO: 3
Cholesterol: 173 mg/dL (ref 0–200)
HDL: 61.3 mg/dL (ref >39.00)
LDL Cholesterol: 86 mg/dL (ref 0–99)
NONHDL: 112.09
TRIGLYCERIDES: 128 mg/dL (ref 0.0–149.0)
VLDL: 25.6 mg/dL (ref 0.0–40.0)

## 2017-03-14 LAB — PSA: PSA: 2.03 ng/mL (ref 0.10–4.00)

## 2017-03-14 LAB — CBC WITH DIFFERENTIAL/PLATELET
BASOS PCT: 1.2 % (ref 0.0–3.0)
Basophils Absolute: 0.1 10*3/uL (ref 0.0–0.1)
Eosinophils Absolute: 0.2 10*3/uL (ref 0.0–0.7)
Eosinophils Relative: 4 % (ref 0.0–5.0)
HEMATOCRIT: 45.5 % (ref 39.0–52.0)
Hemoglobin: 15.7 g/dL (ref 13.0–17.0)
LYMPHS PCT: 34.3 % (ref 12.0–46.0)
Lymphs Abs: 1.9 10*3/uL (ref 0.7–4.0)
MCHC: 34.5 g/dL (ref 30.0–36.0)
MCV: 95.1 fl (ref 78.0–100.0)
MONOS PCT: 10.9 % (ref 3.0–12.0)
Monocytes Absolute: 0.6 10*3/uL (ref 0.1–1.0)
NEUTROS ABS: 2.7 10*3/uL (ref 1.4–7.7)
Neutrophils Relative %: 49.6 % (ref 43.0–77.0)
PLATELETS: 177 10*3/uL (ref 150.0–400.0)
RBC: 4.79 Mil/uL (ref 4.22–5.81)
RDW: 13.1 % (ref 11.5–15.5)
WBC: 5.5 10*3/uL (ref 4.0–10.5)

## 2017-03-14 LAB — TSH: TSH: 4.49 u[IU]/mL (ref 0.35–4.50)

## 2017-03-14 MED ORDER — ATENOLOL-CHLORTHALIDONE 50-25 MG PO TABS: 0.5000 | ORAL_TABLET | Freq: Every day | ORAL | 4 refills | Status: DC

## 2017-03-14 MED ORDER — LEVOTHYROXINE SODIUM 50 MCG PO TABS: 50.0000 ug | ORAL_TABLET | Freq: Every day | ORAL | 4 refills | Status: DC

## 2017-03-14 NOTE — Progress Notes (Addendum)
Edward Miles is a 71 year old married male nonsmoker who comes in today for general physical examination because a history of hypertension and hypothyroidism and mild ED  He takes Tenoretic 5025 doses a half a tab daily for hypertension. BP 122/80  He takes Synthroid 50 g daily because of a history of hypothyroidism  He was given a prescription for generic Viagra but he doesn't need it  He gets routine eye care, dental care, colonoscopy 2015 showed one polyp it was benign. 14 point review of systems reviewed and otherwise negative except for above. Vaccinations up-to-date he had the all shingles vaccine. Asked him to call his insurance company and see if he's a candidate for the new shingles vaccine.  Cognitive function normal he walks daily home health safety reviewed no issues identified, no guns in the house, he does have a healthcare power of attorney and living well  Social history... Married, retired, lives here in [place] with his wife. 2 daughters here 50 in [place]. 6 grandchildren. They have a 30 foot RV which they used to travel.  EKG done because of a history of hypertension. EKG was normal and unchanged from previous cardiograms.  BP 122/80 (BP Location: Left Arm, Patient Position: Sitting, Cuff Size: Large)   Temp 98.5 F (36.9 C) (Oral)   Ht 5\' 10"  (1.778 m)   Wt 223 lb (101.2 kg)   BMI 32.00 kg/m  Examination HEENT were negative neck was supple thyroid is not enlarged no carotid bruits cardiac pulmonary exam normal abdominal exam normal genitalia normal circumcised male rectum normal stool guaiac-negative prostate smooth nonnodular 1+ BPH  Total body skin exam normal except for some seborrheic keratosis  Peripheral pulses normal  #1 hypertension at goal............ continue current therapy  #2 hypothyroidism....... continue Synthroid check labs  #3 mild BPH........... asymptomatic.

## 2017-03-14 NOTE — Patient Instructions (Addendum)
Continue current meds  Follow-up in one year sooner if any problems  Remember to walk 30 minutes daily  Labs today.............Marland Kitchen we will call you if there is anything abnormal

## 2017-03-14 NOTE — Progress Notes (Signed)
Pre visit review using our clinic review tool, if applicable. No additional management support is needed unless otherwise documented below in the visit note. 

## 2017-03-24 NOTE — Progress Notes (Signed)
Order(s) created erroneously. Erroneous order ID: [id number]  Order moved by: Berneice Gandy  Order move date/time: 03/24/2017 3:30 PM  Source Patient: [id number]  Source Contact: 03/14/2017  Destination Patient: [id number]  Destination Contact: 02/20/2013

## 2017-08-26 ENCOUNTER — Encounter: Payer: Self-pay | Admitting: Family Medicine

## 2017-09-12 DIAGNOSIS — H524 Presbyopia: Secondary | ICD-10-CM | POA: Diagnosis not present

## 2018-01-09 DIAGNOSIS — E669 Obesity, unspecified: Secondary | ICD-10-CM | POA: Diagnosis not present

## 2018-01-09 DIAGNOSIS — I10 Essential (primary) hypertension: Secondary | ICD-10-CM | POA: Diagnosis not present

## 2018-01-09 DIAGNOSIS — E039 Hypothyroidism, unspecified: Secondary | ICD-10-CM | POA: Diagnosis not present

## 2018-01-09 DIAGNOSIS — Z7982 Long term (current) use of aspirin: Secondary | ICD-10-CM | POA: Diagnosis not present

## 2018-01-09 DIAGNOSIS — Z6832 Body mass index (BMI) 32.0-32.9, adult: Secondary | ICD-10-CM | POA: Diagnosis not present

## 2018-01-12 DIAGNOSIS — R69 Illness, unspecified: Secondary | ICD-10-CM | POA: Diagnosis not present

## 2018-03-07 ENCOUNTER — Ambulatory Visit (INDEPENDENT_AMBULATORY_CARE_PROVIDER_SITE_OTHER): Payer: Medicare HMO | Admitting: Adult Health

## 2018-03-07 ENCOUNTER — Encounter: Payer: Self-pay | Admitting: Adult Health

## 2018-03-07 VITALS — BP 126/78 | HR 63 | Temp 98.0°F | Wt 227.0 lb

## 2018-03-07 DIAGNOSIS — J0140 Acute pansinusitis, unspecified: Secondary | ICD-10-CM

## 2018-03-07 MED ORDER — HYDROCODONE-HOMATROPINE 5-1.5 MG/5ML PO SYRP: 5.0000 mL | ORAL_SOLUTION | Freq: Three times a day (TID) | ORAL | 0 refills | Status: DC | PRN

## 2018-03-07 MED ORDER — AMOXICILLIN-POT CLAVULANATE 875-125 MG PO TABS: 1.0000 | ORAL_TABLET | Freq: Two times a day (BID) | ORAL | 0 refills | Status: DC

## 2018-03-07 NOTE — Progress Notes (Signed)
Subjective:    Patient ID: Edward Miles, male    DOB: October 06, 1946, 72 y.o.   MRN: [id number]  HPI  72 year old male who  has a past medical history of Colon polyps, Diverticulosis, ED (erectile dysfunction), GERD (gastroesophageal reflux disease), Hypertension, Hypothyroidism, and Obese.  He is a patient of Dr. Sherren Mocha who I am seeing today for an acute complaint of URI. He reports that he had flu like symptoms 4 weeks ago.  These symptoms resolved. Over the last week he reports nasal congestions, productive cough, fatigue, and PND. He had a low grade fever 4 weeks ago but nothing currently.   No n/v/d   Review of Systems See HPI   Past Medical History:  Diagnosis Date  . Colon polyps   . Diverticulosis   . ED (erectile dysfunction)   . GERD (gastroesophageal reflux disease)   . Hypertension   . Hypothyroidism   . Obese     Social History   Socioeconomic History  . Marital status: Married    Spouse name: Not on file  . Number of children: Not on file  . Years of education: Not on file  . Highest education level: Not on file  Occupational History  . Not on file  Social Needs  . Financial resource strain: Not on file  . Food insecurity:    Worry: Not on file    Inability: Not on file  . Transportation needs:    Medical: Not on file    Non-medical: Not on file  Tobacco Use  . Smoking status: Never Smoker  . Smokeless tobacco: Never Used  Substance and Sexual Activity  . Alcohol use: No  . Drug use: No  . Sexual activity: Not on file  Lifestyle  . Physical activity:    Days per week: Not on file    Minutes per session: Not on file  . Stress: Not on file  Relationships  . Social connections:    Talks on phone: Not on file    Gets together: Not on file    Attends religious service: Not on file    Active member of club or organization: Not on file    Attends meetings of clubs or organizations: Not on file    Relationship status: Not on file  . Intimate  partner violence:    Fear of current or ex partner: Not on file    Emotionally abused: Not on file    Physically abused: Not on file    Forced sexual activity: Not on file  Other Topics Concern  . Not on file  Social History Narrative  . Not on file    Past Surgical History:  Procedure Laterality Date  . CHOLECYSTECTOMY      Family History  Problem Relation Age of Onset  . Heart failure Mother   . Hypertension Mother   . Hodgkin's lymphoma Sister   . Colon cancer Neg Hx   . Stomach cancer Neg Hx     No Known Allergies  Current Outpatient Medications on File Prior to Visit  Medication Sig Dispense Refill  . aspirin 81 MG tablet Take 81 mg by mouth daily.      Marland Kitchen atenolol-chlorthalidone (TENORETIC) 50-25 MG tablet Take 0.5 tablets by mouth daily. 100 tablet 4  . levothyroxine (SYNTHROID, LEVOTHROID) 50 MCG tablet Take 1 tablet (50 mcg total) by mouth daily. 100 tablet 4   No current facility-administered medications on file prior to visit.  BP 126/78   Pulse 63   Temp 98 F (36.7 C) (Oral)   Wt 227 lb (103 kg)   SpO2 98%   BMI 32.57 kg/m       Objective:   Physical Exam  Constitutional: He is oriented to person, place, and time. He appears well-developed and well-nourished. No distress.  HENT:  Head: Normocephalic and atraumatic.  Right Ear: Hearing, tympanic membrane, external ear and ear canal normal.  Left Ear: Hearing, tympanic membrane, external ear and ear canal normal.  Nose: Mucosal edema and rhinorrhea present. Right sinus exhibits maxillary sinus tenderness and frontal sinus tenderness. Left sinus exhibits maxillary sinus tenderness and frontal sinus tenderness.  Mouth/Throat: Uvula is midline, oropharynx is clear and moist and mucous membranes are normal.  + PND   Eyes: Pupils are equal, round, and reactive to light. Conjunctivae and EOM are normal.  Cardiovascular: Normal rate, regular rhythm, normal heart sounds and intact distal pulses. Exam  reveals no gallop and no friction rub.  No murmur heard. Pulmonary/Chest: Effort normal and breath sounds normal. No respiratory distress. He has no wheezes. He has no rales. He exhibits no tenderness.  Neurological: He is alert and oriented to person, place, and time.  Skin: Skin is warm and dry. No rash noted. He is not diaphoretic. No erythema. No pallor.  Psychiatric: He has a normal mood and affect. His behavior is normal. Thought content normal.  Nursing note and vitals reviewed.     Assessment & Plan:  1. Acute non-recurrent pansinusitis  - amoxicillin-clavulanate (AUGMENTIN) 875-125 MG tablet; Take 1 tablet by mouth 2 (two) times daily.  Dispense: 14 tablet; Refill: 0 - HYDROcodone-homatropine (HYCODAN) 5-1.5 MG/5ML syrup; Take 5 mLs by mouth every 8 (eight) hours as needed for cough.  Dispense: 120 mL; Refill: 0 - Follow up in 2-3 days if no improvement   Dorothyann Peng, NP

## 2018-03-22 ENCOUNTER — Ambulatory Visit (INDEPENDENT_AMBULATORY_CARE_PROVIDER_SITE_OTHER): Payer: Medicare HMO | Admitting: Family Medicine

## 2018-03-22 ENCOUNTER — Encounter: Payer: Self-pay | Admitting: Family Medicine

## 2018-03-22 VITALS — BP 110/68 | HR 52 | Temp 98.0°F | Ht 70.0 in | Wt 229.0 lb

## 2018-03-22 DIAGNOSIS — E039 Hypothyroidism, unspecified: Secondary | ICD-10-CM

## 2018-03-22 DIAGNOSIS — R0989 Other specified symptoms and signs involving the circulatory and respiratory systems: Secondary | ICD-10-CM | POA: Diagnosis not present

## 2018-03-22 DIAGNOSIS — R351 Nocturia: Secondary | ICD-10-CM

## 2018-03-22 DIAGNOSIS — I10 Essential (primary) hypertension: Secondary | ICD-10-CM | POA: Diagnosis not present

## 2018-03-22 DIAGNOSIS — Z0000 Encounter for general adult medical examination without abnormal findings: Secondary | ICD-10-CM | POA: Diagnosis not present

## 2018-03-22 DIAGNOSIS — N401 Enlarged prostate with lower urinary tract symptoms: Secondary | ICD-10-CM | POA: Diagnosis not present

## 2018-03-22 LAB — BASIC METABOLIC PANEL
BUN: 19 mg/dL (ref 6–23)
CHLORIDE: 100 meq/L (ref 96–112)
CO2: 33 meq/L — AB (ref 19–32)
Calcium: 9.4 mg/dL (ref 8.4–10.5)
Creatinine, Ser: 0.86 mg/dL (ref 0.40–1.50)
GFR: 93.5 mL/min (ref >60.00)
Glucose, Bld: 70 mg/dL (ref 70–99)
POTASSIUM: 4.2 meq/L (ref 3.5–5.1)
Sodium: 138 mEq/L (ref 135–145)

## 2018-03-22 LAB — CBC WITH DIFFERENTIAL/PLATELET
BASOS ABS: 0.1 10*3/uL (ref 0.0–0.1)
Basophils Relative: 1.5 % (ref 0.0–3.0)
Eosinophils Absolute: 0.5 10*3/uL (ref 0.0–0.7)
Eosinophils Relative: 9.5 % — ABNORMAL HIGH (ref 0.0–5.0)
HCT: 43 % (ref 39.0–52.0)
Hemoglobin: 15.1 g/dL (ref 13.0–17.0)
LYMPHS ABS: 1.7 10*3/uL (ref 0.7–4.0)
Lymphocytes Relative: 30.8 % (ref 12.0–46.0)
MCHC: 35 g/dL (ref 30.0–36.0)
MCV: 94 fl (ref 78.0–100.0)
MONO ABS: 0.8 10*3/uL (ref 0.1–1.0)
Monocytes Relative: 14.2 % — ABNORMAL HIGH (ref 3.0–12.0)
NEUTROS PCT: 44 % (ref 43.0–77.0)
Neutro Abs: 2.4 10*3/uL (ref 1.4–7.7)
PLATELETS: 188 10*3/uL (ref 150.0–400.0)
RBC: 4.57 Mil/uL (ref 4.22–5.81)
RDW: 13.1 % (ref 11.5–15.5)
WBC: 5.4 10*3/uL (ref 4.0–10.5)

## 2018-03-22 LAB — HEPATIC FUNCTION PANEL
ALBUMIN: 3.9 g/dL (ref 3.5–5.2)
ALT: 37 U/L (ref 0–53)
AST: 27 U/L (ref 0–37)
Alkaline Phosphatase: 58 U/L (ref 39–117)
Bilirubin, Direct: 0.1 mg/dL (ref 0.0–0.3)
TOTAL PROTEIN: 6.6 g/dL (ref 6.0–8.3)
Total Bilirubin: 0.7 mg/dL (ref 0.2–1.2)

## 2018-03-22 LAB — POCT URINALYSIS DIPSTICK
Bilirubin, UA: NEGATIVE
GLUCOSE UA: NEGATIVE
KETONES UA: NEGATIVE
LEUKOCYTES UA: NEGATIVE
NITRITE UA: NEGATIVE
Protein, UA: NEGATIVE
RBC UA: NEGATIVE
Urobilinogen, UA: 0.2 E.U./dL
pH, UA: 6 (ref 5.0–8.0)

## 2018-03-22 LAB — LIPID PANEL
CHOLESTEROL: 162 mg/dL (ref 0–200)
HDL: 63.9 mg/dL (ref >39.00)
LDL Cholesterol: 83 mg/dL (ref 0–99)
NonHDL: 97.98
TRIGLYCERIDES: 73 mg/dL (ref 0.0–149.0)
Total CHOL/HDL Ratio: 3
VLDL: 14.6 mg/dL (ref 0.0–40.0)

## 2018-03-22 LAB — TSH: TSH: 4.37 u[IU]/mL (ref 0.35–4.50)

## 2018-03-22 LAB — PSA: PSA: 2.07 ng/mL (ref 0.10–4.00)

## 2018-03-22 MED ORDER — ATENOLOL-CHLORTHALIDONE 50-25 MG PO TABS: 0.5000 | ORAL_TABLET | Freq: Every day | ORAL | 4 refills | Status: DC

## 2018-03-22 MED ORDER — SILDENAFIL CITRATE 20 MG PO TABS: ORAL_TABLET | ORAL | 11 refills | Status: DC

## 2018-03-22 MED ORDER — LEVOTHYROXINE SODIUM 50 MCG PO TABS: 50.0000 ug | ORAL_TABLET | Freq: Every day | ORAL | 4 refills | Status: DC

## 2018-03-22 NOTE — Patient Instructions (Signed)
Labs today............ I will call you this anything abnormal  Continue your current medications  We'll get a follow-up ultrasound of your right carotid artery because of the bruit  Cardiac insurance company finder wearing get the shingles vaccine  Return in one year for general physical exam sooner if any problems

## 2018-03-22 NOTE — Progress Notes (Signed)
Edward Miles is a 72 year old married male nonsmoker........ Still working Engineering geologist....... Who comes in today for general physical examination because of a history of hypertension, hypothyroidism, right carotid bruit.  He's on Tenoretic 50-25 dose one half tab daily for hypertension. BP within normal limits 110/68. Pulse 52 and regular  He takes Synthroid 50 g daily because a history of hypothyroidism  He gets routine eye care, dental care, colonoscopy 2015 normal  Vaccinations up-to-date information given on shingles vaccine.  2 years ago we detected a right carotid bruit. Carotid ultrasound was reportedly normal. He continues to be asymptomatic. LDL cholesterol was 86 HDL 61  14 point review of systems reviewed and otherwise negative  EKG was done because a history of hypertension. 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 He walks on a daily basis  BP 110/68 (BP Location: Left Arm, Patient Position: Sitting, Cuff Size: Large)   Pulse (!) 52   Temp 98 F (36.7 C) (Oral)   Ht 5\' 10"  (1.778 m)   Wt 229 lb (103.9 kg)   BMI 32.86 kg/m  Well-developed well-nourished male no acute distress vital signs stable he is afebrile HEENT were negative except for right carotid bruit. Soft and unchanged from 2 years ago. Cardiopulmonary exam normal. Abdominal exam normal except for scar right upper quadrant from previous cholecystectomy. Genitalia normal circumcised male rectum normal. Guaiac negative prostate smooth nonnodular 1+ BPH  Extremities normal skin normal peripheral pulses normal except for carotid bruits noted above  #1. Hypertension at goal....... Continue current therapy  #2 hypothyroidism....... Continue Synthroid check labs  #3 right carotid bruit.......... Follow-up carotid ultrasound. Continue to call for risk factors. Blood sugar normal lipids normal and blood pressure  normal 1 medication.

## 2018-03-24 ENCOUNTER — Other Ambulatory Visit: Payer: Self-pay | Admitting: Family Medicine

## 2018-03-24 DIAGNOSIS — I6523 Occlusion and stenosis of bilateral carotid arteries: Secondary | ICD-10-CM

## 2018-03-24 DIAGNOSIS — R0989 Other specified symptoms and signs involving the circulatory and respiratory systems: Secondary | ICD-10-CM

## 2018-03-27 ENCOUNTER — Ambulatory Visit (HOSPITAL_COMMUNITY)
Admission: RE | Admit: 2018-03-27 | Discharge: 2018-03-27 | Disposition: A | Discharge status: Home / Self Care | Payer: Medicare HMO | Source: Ambulatory Visit | Attending: Cardiology | Admitting: Cardiology

## 2018-03-27 DIAGNOSIS — I10 Essential (primary) hypertension: Secondary | ICD-10-CM | POA: Insufficient documentation

## 2018-03-27 DIAGNOSIS — I6523 Occlusion and stenosis of bilateral carotid arteries: Secondary | ICD-10-CM

## 2018-03-27 DIAGNOSIS — I6522 Occlusion and stenosis of left carotid artery: Secondary | ICD-10-CM | POA: Insufficient documentation

## 2018-03-27 DIAGNOSIS — R0989 Other specified symptoms and signs involving the circulatory and respiratory systems: Secondary | ICD-10-CM | POA: Diagnosis not present

## 2018-09-07 ENCOUNTER — Encounter: Payer: Self-pay | Admitting: Adult Health

## 2018-09-07 ENCOUNTER — Ambulatory Visit (INDEPENDENT_AMBULATORY_CARE_PROVIDER_SITE_OTHER): Payer: Medicare HMO | Admitting: Adult Health

## 2018-09-07 VITALS — BP 124/74 | HR 65 | Temp 98.2°F | Ht 70.0 in | Wt 230.2 lb

## 2018-09-07 DIAGNOSIS — Z7689 Persons encountering health services in other specified circumstances: Secondary | ICD-10-CM | POA: Diagnosis not present

## 2018-09-07 DIAGNOSIS — M5432 Sciatica, left side: Secondary | ICD-10-CM

## 2018-09-07 DIAGNOSIS — Z23 Encounter for immunization: Secondary | ICD-10-CM | POA: Diagnosis not present

## 2018-09-07 DIAGNOSIS — I10 Essential (primary) hypertension: Secondary | ICD-10-CM | POA: Diagnosis not present

## 2018-09-07 DIAGNOSIS — E039 Hypothyroidism, unspecified: Secondary | ICD-10-CM

## 2018-09-07 NOTE — Progress Notes (Signed)
Patient presents to clinic today to establish care. He is a pleasant 72 year old male who  has a past medical history of Colon polyps, Diverticulosis, ED (erectile dysfunction), GERD (gastroesophageal reflux disease), Hypertension, Hypothyroidism, and Obese.  He is a former patient of Dr. Sherren Mocha who is transferring care  His last CPE was in 03/2018   Acute Concerns: Establish Care   Left side Sciatica. - reports started about a week ago after lifting an object that caused back pain ( resolved). Pain is intermittent, has felt as a burning pain and dull pain. Radiates to left knee. Pain is worse when standing for an extended period of time.   Chronic Issues:  Essential Hypertension - Takes Tenoretic 50-25 mg - 1/2 pill for hypertension.  BP Readings from Last 3 Encounters:  09/07/18 124/74  03/22/18 110/68  03/07/18 126/78   Hypothyroidism - Takes synthroid 50 mcg  Lab Results  Component Value Date   TSH 4.37 03/22/2018   Left carotid Bruit - was found on exam in 2019. Had dopplers study done that showed 1-39% blockage. Recommend yearly. Denies any symptoms.   Health Maintenance: Dental --Routine  Vision -- Routine  Immunizations -- UTD  Colonoscopy -- UTD  Past Medical History:  Diagnosis Date  . Colon polyps   . Diverticulosis   . ED (erectile dysfunction)   . GERD (gastroesophageal reflux disease)   . Hypertension   . Hypothyroidism   . Obese     Past Surgical History:  Procedure Laterality Date  . CHOLECYSTECTOMY      Current Outpatient Medications on File Prior to Visit  Medication Sig Dispense Refill  . aspirin 81 MG tablet Take 81 mg by mouth daily.      Marland Kitchen atenolol-chlorthalidone (TENORETIC) 50-25 MG tablet Take 0.5 tablets by mouth daily. 100 tablet 4  . levothyroxine (SYNTHROID, LEVOTHROID) 50 MCG tablet Take 1 tablet (50 mcg total) by mouth daily. 100 tablet 4  . sildenafil (REVATIO) 20 MG tablet Take 1 to 2 tabs 2 - 3 hours before sex 30 tablet 11    No current facility-administered medications on file prior to visit.     No Known Allergies  Family History  Problem Relation Age of Onset  . Heart failure Mother   . Hypertension Mother   . Hodgkin's lymphoma Sister   . Colon cancer Neg Hx   . Stomach cancer Neg Hx     Social History   Socioeconomic History  . Marital status: Married    Spouse name: Not on file  . Number of children: Not on file  . Years of education: Not on file  . Highest education level: Not on file  Occupational History  . Not on file  Social Needs  . Financial resource strain: Not on file  . Food insecurity:    Worry: Not on file    Inability: Not on file  . Transportation needs:    Medical: Not on file    Non-medical: Not on file  Tobacco Use  . Smoking status: Never Smoker  . Smokeless tobacco: Never Used  Substance and Sexual Activity  . Alcohol use: No  . Drug use: No  . Sexual activity: Not on file  Lifestyle  . Physical activity:    Days per week: Not on file    Minutes per session: Not on file  . Stress: Not on file  Relationships  . Social connections:    Talks on phone: Not on file  Gets together: Not on file    Attends religious service: Not on file    Active member of club or organization: Not on file    Attends meetings of clubs or organizations: Not on file    Relationship status: Not on file  . Intimate partner violence:    Fear of current or ex partner: Not on file    Emotionally abused: Not on file    Physically abused: Not on file    Forced sexual activity: Not on file  Other Topics Concern  . Not on file  Social History Narrative  . Not on file    Review of Systems  Constitutional: Negative.   HENT: Negative.   Respiratory: Negative.   Cardiovascular: Negative.   Gastrointestinal: Negative.   Genitourinary: Negative.   Musculoskeletal: Negative.   Skin: Negative.   Neurological: Negative.   Endo/Heme/Allergies: Negative.   Psychiatric/Behavioral:  Negative.   All other systems reviewed and are negative.     BP 124/74 (BP Location: Left Arm, Patient Position: Sitting, Cuff Size: Normal)   Pulse 65   Temp 98.2 F (36.8 C) (Oral)   Ht 5\' 10"  (1.778 m)   Wt 230 lb 3.2 oz (104.4 kg)   SpO2 98%   BMI 33.03 kg/m   Physical Exam  Constitutional: He is oriented to person, place, and time. He appears well-developed and well-nourished. No distress.  Eyes: Pupils are equal, round, and reactive to light. Conjunctivae and EOM are normal. Right eye exhibits no discharge. Left eye exhibits no discharge. No scleral icterus.  Neck: Normal range of motion. Neck supple. No JVD present. Carotid bruit is present (very mild carotid bruit on left). No tracheal deviation present. No thyroid mass and no thyromegaly present.  Cardiovascular: Normal rate, regular rhythm, normal heart sounds and intact distal pulses. Exam reveals no gallop and no friction rub.  No murmur heard. Pulmonary/Chest: Effort normal and breath sounds normal. No stridor. No respiratory distress. He has no wheezes. He has no rales. He exhibits no tenderness.  Abdominal: No hernia.  Musculoskeletal: Normal range of motion. He exhibits no edema, tenderness or deformity.  Lymphadenopathy:    He has no cervical adenopathy.  Neurological: He is alert and oriented to person, place, and time. He displays normal reflexes. No cranial nerve deficit or sensory deficit. He exhibits normal muscle tone. Coordination normal.  Skin: Skin is warm and dry. Capillary refill takes less than 2 seconds. No rash noted. He is not diaphoretic. No erythema. No pallor.  Psychiatric: He has a normal mood and affect. His behavior is normal. Judgment and thought content normal.  Nursing note and vitals reviewed.  Assessment/Plan: 1. Encounter to establish care - Follow up in April 2020 for CPE or sooner if needed - Stay active and eat healthy  - Flu shot given today   2. Essential hypertension - Well  controlled. No change in medications   3. Hypothyroidism, unspecified type - Well controlled. No change in medications   4. Sciatica of left side - Advised stretching exercise, heating pad, Nsaids.  - Follow up if no improvement  Dorothyann Peng, NP

## 2018-12-04 DIAGNOSIS — H524 Presbyopia: Secondary | ICD-10-CM | POA: Diagnosis not present

## 2019-01-19 ENCOUNTER — Telehealth: Payer: Self-pay | Admitting: General Practice

## 2019-01-19 ENCOUNTER — Other Ambulatory Visit: Payer: Self-pay | Admitting: Adult Health

## 2019-01-19 MED ORDER — ATENOLOL 25 MG PO TABS: 25.0000 mg | ORAL_TABLET | Freq: Every day | ORAL | 1 refills | Status: DC

## 2019-01-19 MED ORDER — CHLORTHALIDONE 25 MG PO TABS: 25.0000 mg | ORAL_TABLET | Freq: Every day | ORAL | 3 refills | Status: DC

## 2019-01-19 NOTE — Telephone Encounter (Signed)
Cory, Please send in if authorized.

## 2019-01-19 NOTE — Telephone Encounter (Signed)
Copied from [place] [phone]. Topic: Quick Communication - See Telephone Encounter >> Jan 19, 2019  2:29 PM Conception Chancy, NT wrote: CRM for notification. See Telephone encounter for: 01/19/19.   [facility] is calling and states that patients insurance is giving him better prices if atenolol-chlorthalidone (TENORETIC) 50-25 MG tablet is split into 2 prescriptions. Please advise.  [id number] [place] [place] - [address] AT [address] [place] [address] Phone: [phone] Fax: [phone] Not a 24 hour pharmacy; exact hours not known

## 2019-01-24 ENCOUNTER — Other Ambulatory Visit: Payer: Self-pay | Admitting: Family Medicine

## 2019-01-24 MED ORDER — CHLORTHALIDONE 25 MG PO TABS: 12.5000 mg | ORAL_TABLET | Freq: Every day | ORAL | 3 refills | Status: DC

## 2019-01-24 NOTE — Telephone Encounter (Signed)
This was send in by e-scribe earlier today.

## 2019-01-24 NOTE — Telephone Encounter (Signed)
Pharmacy called and stated that RX for chlorthalidone (HYGROTON) 25 MG tablet [[id number]] should still states take 1/2 tablet per day. Not 1 tablet for day   Please advise   [phone]

## 2019-03-27 ENCOUNTER — Encounter: Payer: Medicare HMO | Admitting: Adult Health

## 2019-03-28 ENCOUNTER — Encounter: Payer: Medicare HMO | Admitting: Adult Health

## 2019-05-02 DIAGNOSIS — R69 Illness, unspecified: Secondary | ICD-10-CM | POA: Diagnosis not present

## 2019-05-04 ENCOUNTER — Ambulatory Visit (INDEPENDENT_AMBULATORY_CARE_PROVIDER_SITE_OTHER): Payer: Medicare HMO | Admitting: Family Medicine

## 2019-05-04 ENCOUNTER — Other Ambulatory Visit: Payer: Self-pay

## 2019-05-04 ENCOUNTER — Encounter: Payer: Self-pay | Admitting: Family Medicine

## 2019-05-04 DIAGNOSIS — L03116 Cellulitis of left lower limb: Secondary | ICD-10-CM | POA: Diagnosis not present

## 2019-05-04 MED ORDER — DOXYCYCLINE HYCLATE 100 MG PO TABS: 100.0000 mg | ORAL_TABLET | Freq: Two times a day (BID) | ORAL | 0 refills | Status: AC

## 2019-05-04 NOTE — Progress Notes (Signed)
Virtual Visit via Video Note  I connected with Edward Miles on 05/04/19 at  1:30 PM EDT by a video enabled telemedicine application and verified that I am speaking with the correct person using two identifiers.  Location patient: home Location provider:work or home office Persons participating in the virtual visit: patient, provider  I discussed the limitations of evaluation and management by telemedicine and the availability of in person appointments. The patient expressed understanding and agreed to proceed.   HPI: Went camping at [facility] over the weekend. Saturday noticed a lesion on L foot proximal to 5th digit.  Does not recall being bit by anything.   Did get mosquito bites elsewhere on body.  Area on foot is sore, mildly pruritic, and has become discolored in the center.  Pt also with soreness lateral to the lesion.  Pt wore comfortable shoes that were not tight during his camping trip.  Putting cortisone on area.  Denies fever, chills, n/v.  NKDA.  ROS: See pertinent positives and negatives per HPI.  Past Medical History:  Diagnosis Date  . Colon polyps   . Diverticulosis   . ED (erectile dysfunction)   . Hypertension   . Hypothyroidism   . Obese     Past Surgical History:  Procedure Laterality Date  . CHOLECYSTECTOMY      Family History  Problem Relation Age of Onset  . Heart failure Mother   . Hypertension Mother   . Hypothyroidism Mother   . Hodgkin's lymphoma Sister   . Hypothyroidism Maternal Grandmother   . Colon cancer Neg Hx   . Stomach cancer Neg Hx     SOCIAL HX:    Current Outpatient Medications:  .  aspirin 81 MG tablet, Take 81 mg by mouth daily.  , Disp: , Rfl:  .  atenolol (TENORMIN) 25 MG tablet, Take 1 tablet (25 mg total) by mouth daily., Disp: 90 tablet, Rfl: 1 .  chlorthalidone (HYGROTON) 25 MG tablet, Take 0.5 tablets (12.5 mg total) by mouth daily., Disp: 45 tablet, Rfl: 3 .  levothyroxine (SYNTHROID, LEVOTHROID) 50 MCG tablet,  Take 1 tablet (50 mcg total) by mouth daily., Disp: 100 tablet, Rfl: 4 .  sildenafil (REVATIO) 20 MG tablet, Take 1 to 2 tabs 2 - 3 hours before sex, Disp: 30 tablet, Rfl: 11  EXAM:  VITALS per patient if applicable:  RR between 12-20 bpm  GENERAL: alert, oriented, appears well and in no acute distress  HEENT: atraumatic, conjunctiva clear, no obvious abnormalities on inspection of external nose and ears  NECK: normal movements of the head and neck  LUNGS: on inspection no signs of respiratory distress, breathing rate appears normal, no obvious gross SOB, gasping or wheezing  CV: no obvious cyanosis  SKIN:  1 cm circular abrasion on lateral L foot proximal to 5th difit with surrounding area of erythema with dark purple colored center.  No streaking or drainage noted.  TTP medial to abrasion proximal to 3 and 4th digits of L foot.  MS: moves all visible extremities without noticeable abnormality  PSYCH/NEURO: pleasant and cooperative, no obvious depression or anxiety, speech and thought processing grossly intact  ASSESSMENT AND PLAN:  Discussed the following assessment and plan:  Cellulitis of left foot  -keep area clean and dry -given precautions - Plan: doxycycline (VIBRA-TABS) 100 MG tablet -f/u with pcp     I discussed the assessment and treatment plan with the patient. The patient was provided an opportunity to ask questions and all  were answered. The patient agreed with the plan and demonstrated an understanding of the instructions.   The patient was advised to call back or seek an in-person evaluation if the symptoms worsen or if the condition fails to improve as anticipated.  Billie Ruddy, MD

## 2019-05-18 ENCOUNTER — Telehealth: Payer: Self-pay | Admitting: Adult Health

## 2019-05-18 MED ORDER — LEVOTHYROXINE SODIUM 50 MCG PO TABS: 50.0000 ug | ORAL_TABLET | Freq: Every day | ORAL | 0 refills | Status: DC

## 2019-05-18 MED ORDER — CHLORTHALIDONE 25 MG PO TABS: 12.5000 mg | ORAL_TABLET | Freq: Every day | ORAL | 0 refills | Status: DC

## 2019-05-18 MED ORDER — ATENOLOL 25 MG PO TABS: 25.0000 mg | ORAL_TABLET | Freq: Every day | ORAL | 0 refills | Status: DC

## 2019-05-18 NOTE — Telephone Encounter (Unsigned)
Copied from [place] [id number]. Topic: Quick Communication - Rx Refill/Question >> May 18, 2019 11:26 AM Celene Kras A wrote: Medication: atenolol, chlorthanidone, levothyroxine  Has the patient contacted their pharmacy? No. (Agent: If no, request that the patient contact the pharmacy for the refill.) (Agent: If yes, when and what did the pharmacy advise?)  Preferred Pharmacy (with phone number or street name): [id number] [place], [address]. AT [address]. [place] [address] Phone: [phone] Fax: [phone] Not a 24 hour pharmacy; exact hours not known.    Agent: Please be advised that RX refills may take up to 3 business days. We ask that you follow-up with your pharmacy.

## 2019-05-18 NOTE — Telephone Encounter (Signed)
Sent to the pharmacy by e-scribe. 

## 2019-06-15 ENCOUNTER — Encounter: Payer: Medicare HMO | Admitting: Adult Health

## 2019-06-20 ENCOUNTER — Other Ambulatory Visit: Payer: Self-pay

## 2019-06-20 ENCOUNTER — Ambulatory Visit (INDEPENDENT_AMBULATORY_CARE_PROVIDER_SITE_OTHER): Payer: Medicare HMO | Admitting: Adult Health

## 2019-06-20 ENCOUNTER — Ambulatory Visit (INDEPENDENT_AMBULATORY_CARE_PROVIDER_SITE_OTHER): Payer: Medicare HMO

## 2019-06-20 ENCOUNTER — Encounter: Payer: Self-pay | Admitting: Adult Health

## 2019-06-20 VITALS — BP 118/84 | Temp 97.7°F | Ht 71.0 in | Wt 228.0 lb

## 2019-06-20 DIAGNOSIS — E039 Hypothyroidism, unspecified: Secondary | ICD-10-CM | POA: Diagnosis not present

## 2019-06-20 DIAGNOSIS — I10 Essential (primary) hypertension: Secondary | ICD-10-CM | POA: Diagnosis not present

## 2019-06-20 DIAGNOSIS — N401 Enlarged prostate with lower urinary tract symptoms: Secondary | ICD-10-CM

## 2019-06-20 DIAGNOSIS — M5442 Lumbago with sciatica, left side: Secondary | ICD-10-CM | POA: Diagnosis not present

## 2019-06-20 DIAGNOSIS — Z0000 Encounter for general adult medical examination without abnormal findings: Secondary | ICD-10-CM

## 2019-06-20 DIAGNOSIS — G8929 Other chronic pain: Secondary | ICD-10-CM

## 2019-06-20 DIAGNOSIS — R0989 Other specified symptoms and signs involving the circulatory and respiratory systems: Secondary | ICD-10-CM

## 2019-06-20 DIAGNOSIS — M545 Low back pain: Secondary | ICD-10-CM | POA: Diagnosis not present

## 2019-06-20 DIAGNOSIS — R351 Nocturia: Secondary | ICD-10-CM

## 2019-06-20 LAB — COMPREHENSIVE METABOLIC PANEL
ALT: 31 U/L (ref 0–53)
AST: 31 U/L (ref 0–37)
Albumin: 4.3 g/dL (ref 3.5–5.2)
Alkaline Phosphatase: 65 U/L (ref 39–117)
BUN: 19 mg/dL (ref 6–23)
CO2: 28 mEq/L (ref 19–32)
Calcium: 9.2 mg/dL (ref 8.4–10.5)
Chloride: 103 mEq/L (ref 96–112)
Creatinine, Ser: 0.84 mg/dL (ref 0.40–1.50)
GFR: 90.07 mL/min (ref >60.00)
Glucose, Bld: 85 mg/dL (ref 70–99)
Potassium: 3.9 mEq/L (ref 3.5–5.1)
Sodium: 139 mEq/L (ref 135–145)
Total Bilirubin: 1.1 mg/dL (ref 0.2–1.2)
Total Protein: 6.9 g/dL (ref 6.0–8.3)

## 2019-06-20 LAB — PSA: PSA: 1.53 ng/mL (ref 0.10–4.00)

## 2019-06-20 LAB — LIPID PANEL
Cholesterol: 179 mg/dL (ref 0–200)
HDL: 62.7 mg/dL (ref >39.00)
LDL Cholesterol: 97 mg/dL (ref 0–99)
NonHDL: 115.81
Total CHOL/HDL Ratio: 3
Triglycerides: 92 mg/dL (ref 0.0–149.0)
VLDL: 18.4 mg/dL (ref 0.0–40.0)

## 2019-06-20 LAB — TSH: TSH: 3.53 u[IU]/mL (ref 0.35–4.50)

## 2019-06-20 LAB — CBC WITH DIFFERENTIAL/PLATELET
Basophils Absolute: 0.1 10*3/uL (ref 0.0–0.1)
Basophils Relative: 2 % (ref 0.0–3.0)
Eosinophils Absolute: 0.4 10*3/uL (ref 0.0–0.7)
Eosinophils Relative: 6.1 % — ABNORMAL HIGH (ref 0.0–5.0)
HCT: 45.4 % (ref 39.0–52.0)
Hemoglobin: 15.7 g/dL (ref 13.0–17.0)
Lymphocytes Relative: 34.3 % (ref 12.0–46.0)
Lymphs Abs: 2 10*3/uL (ref 0.7–4.0)
MCHC: 34.5 g/dL (ref 30.0–36.0)
MCV: 95.3 fl (ref 78.0–100.0)
Monocytes Absolute: 0.6 10*3/uL (ref 0.1–1.0)
Monocytes Relative: 10.1 % (ref 3.0–12.0)
Neutro Abs: 2.7 10*3/uL (ref 1.4–7.7)
Neutrophils Relative %: 47.5 % (ref 43.0–77.0)
Platelets: 180 10*3/uL (ref 150.0–400.0)
RBC: 4.76 Mil/uL (ref 4.22–5.81)
RDW: 12.9 % (ref 11.5–15.5)
WBC: 5.8 10*3/uL (ref 4.0–10.5)

## 2019-06-20 MED ORDER — METHYLPREDNISOLONE 4 MG PO TBPK: ORAL_TABLET | ORAL | 0 refills | Status: DC

## 2019-06-20 NOTE — Patient Instructions (Addendum)
It was great seeing you today   We will follow up with you regarding your blood work and x ray   I have sent in a prescription for Prednisone to see if that helps with the sciatica.

## 2019-06-20 NOTE — Progress Notes (Signed)
Subjective:    Patient ID: Edward Miles, male    DOB: [birth year], 73 y.o.   MRN: [id number]  HPI  Patient presents for yearly preventative medicine examination. He is a pleasant 73 year old male who  has a past medical history of Colon polyps, Diverticulosis, ED (erectile dysfunction), Hypertension, Hypothyroidism, and Obese.   Essential Hypertension - takes Tenoretic 50-25 mg ( 1/2 tab) daily.  He denies chest pain, shortness of breath, dizziness, lightheadedness, or syncopal episodes  BP Readings from Last 3 Encounters:  06/20/19 118/84  09/07/18 124/74  03/22/18 110/68    Hypothyroidism - well controlled with Synthroid 50 mcg daily  Lab Results  Component Value Date   TSH 4.37 03/22/2018   Left Carotid Bruit-was found on exam in 2019.  He had Dopplers done in April 2019 which showed Mild blockage in his left carotid oh 1 to 39%.  Right carotid artery was normal.   Low back pain with left sided sciatica -he was originally seen in October 2019.  This time his symptoms have been present for approximately 1 week and the aggravating factor was picking up a heavy object.  He was advised stretching exercises, anti-inflammatories and heating pad.  Today he reports that he continues to have intermittne low back pain that is exacerbated when he lifts heavy object but has consistent numbness down the outside of his left leg.   All immunizations and health maintenance protocols were reviewed with the patient and needed orders were placed.  Appropriate screening laboratory values were ordered for the patient including screening of hyperlipidemia, renal function and hepatic function. If indicated by BPH, a PSA was ordered.  Medication reconciliation,  past medical history, social history, problem list and allergies were reviewed in detail with the patient  Goals were established with regard to weight loss, exercise, and  diet in compliance with medications.  He is staying active with  hiking and walking.  He eats a heart healthy diet  End of life planning was discussed.  His last colonoscopy was in 2015.  He is on the 10-year plan.  He is up-to-date on routine dental and vision screens  Review of Systems  Constitutional: Negative.   HENT: Negative.   Eyes: Negative.   Respiratory: Negative.   Cardiovascular: Negative.   Gastrointestinal: Negative.   Endocrine: Negative.   Genitourinary: Negative.   Musculoskeletal: Negative.   Allergic/Immunologic: Negative.   Neurological: Negative.   Hematological: Negative.   Psychiatric/Behavioral: Negative.   All other systems reviewed and are negative.  Past Medical History:  Diagnosis Date  . Colon polyps   . Diverticulosis   . ED (erectile dysfunction)   . Hypertension   . Hypothyroidism   . Obese     Social History   Socioeconomic History  . Marital status: Married    Spouse name: Not on file  . Number of children: Not on file  . Years of education: Not on file  . Highest education level: Not on file  Occupational History  . Not on file  Social Needs  . Financial resource strain: Not on file  . Food insecurity    Worry: Not on file    Inability: Not on file  . Transportation needs    Medical: Not on file    Non-medical: Not on file  Tobacco Use  . Smoking status: Never Smoker  . Smokeless tobacco: Never Used  Substance and Sexual Activity  . Alcohol use: No  . Drug use: No  .  Sexual activity: Not on file  Lifestyle  . Physical activity    Days per week: Not on file    Minutes per session: Not on file  . Stress: Not on file  Relationships  . Social Herbalist on phone: Not on file    Gets together: Not on file    Attends religious service: Not on file    Active member of club or organization: Not on file    Attends meetings of clubs or organizations: Not on file    Relationship status: Not on file  . Intimate partner violence    Fear of current or ex partner: Not on file     Emotionally abused: Not on file    Physically abused: Not on file    Forced sexual activity: Not on file  Other Topics Concern  . Not on file  Social History Narrative   Geophysical data processor    Married     Past Surgical History:  Procedure Laterality Date  . CHOLECYSTECTOMY      Family History  Problem Relation Age of Onset  . Heart failure Mother   . Hypertension Mother   . Hypothyroidism Mother   . Hodgkin's lymphoma Sister   . Hypothyroidism Maternal Grandmother   . Colon cancer Neg Hx   . Stomach cancer Neg Hx     No Known Allergies  Current Outpatient Medications on File Prior to Visit  Medication Sig Dispense Refill  . aspirin 81 MG tablet Take 81 mg by mouth daily.      Marland Kitchen atenolol (TENORMIN) 25 MG tablet Take 1 tablet (25 mg total) by mouth daily. 90 tablet 0  . chlorthalidone (HYGROTON) 25 MG tablet Take 0.5 tablets (12.5 mg total) by mouth daily. 45 tablet 0  . levothyroxine (SYNTHROID) 50 MCG tablet Take 1 tablet (50 mcg total) by mouth daily. 90 tablet 0  . sildenafil (REVATIO) 20 MG tablet Take 1 to 2 tabs 2 - 3 hours before sex 30 tablet 11   No current facility-administered medications on file prior to visit.     BP 118/84   Temp 97.7 F (36.5 C)   Ht 5\' 11"  (1.803 m)   Wt 228 lb (103.4 kg)   BMI 31.80 kg/m       Objective:   Physical Exam Vitals signs and nursing note reviewed.  Constitutional:      Appearance: Normal appearance.  HENT:     Head: Normocephalic and atraumatic.     Right Ear: Tympanic membrane, ear canal and external ear normal. There is no impacted cerumen.     Left Ear: Tympanic membrane, ear canal and external ear normal. There is no impacted cerumen.     Nose: Nose normal. No congestion or rhinorrhea.     Mouth/Throat:     Mouth: Mucous membranes are dry.     Pharynx: Oropharynx is clear. No oropharyngeal exudate or posterior oropharyngeal erythema.  Eyes:     Extraocular Movements: Extraocular movements intact.      Conjunctiva/sclera: Conjunctivae normal.     Pupils: Pupils are equal, round, and reactive to light.  Neck:     Musculoskeletal: Normal range of motion and neck supple.     Vascular: Carotid bruit (faint in left carotid ) present.  Cardiovascular:     Rate and Rhythm: Normal rate and regular rhythm.     Pulses: Normal pulses.     Heart sounds: Normal heart sounds. No murmur. No friction rub. No gallop.  Pulmonary:     Effort: Pulmonary effort is normal. No respiratory distress.     Breath sounds: Normal breath sounds. No stridor. No wheezing, rhonchi or rales.  Chest:     Chest wall: No tenderness.  Abdominal:     General: Abdomen is flat. Bowel sounds are normal. There is no distension.     Palpations: Abdomen is soft. There is no mass.     Tenderness: There is no abdominal tenderness. There is no right CVA tenderness, left CVA tenderness, guarding or rebound.     Hernia: No hernia is present.  Musculoskeletal: Normal range of motion.        General: Tenderness (mild tenderness with palpation along the left sciatic nerve) present. No swelling, deformity or signs of injury.     Right lower leg: No edema.     Left lower leg: No edema.  Skin:    General: Skin is warm and dry.     Capillary Refill: Capillary refill takes less than 2 seconds.     Coloration: Skin is not jaundiced or pale.     Findings: No bruising, erythema, lesion or rash.  Neurological:     General: No focal deficit present.     Mental Status: He is alert and oriented to person, place, and time.     Cranial Nerves: No cranial nerve deficit.     Sensory: No sensory deficit.     Motor: No weakness.     Coordination: Coordination normal.     Gait: Gait normal.     Deep Tendon Reflexes: Reflexes normal.  Psychiatric:        Mood and Affect: Mood normal.        Behavior: Behavior normal.        Thought Content: Thought content normal.        Judgment: Judgment normal.        Assessment & Plan:  1. Routine  general medical examination at a health care facility - Continue to exercise and eat healthy - follow up in one year or sooner if needed - CBC with Differential/Platelet - Comprehensive metabolic panel - Lipid panel - TSH  2. BPH associated with nocturia  - PSA  3. Hypothyroidism, unspecified type - Consider increase in synthroid  - CBC with Differential/Platelet - Comprehensive metabolic panel - Lipid panel  4. Essential hypertension - Well controlled. No change in medication  - CBC with Differential/Platelet - Comprehensive metabolic panel - Lipid panel - TSH  5. Left carotid bruit - Follow up Carotid US next year  6. Chronic midline low back pain with left-sided sciatica - Consider referral to PT or Orthopedics  - DG Lumbar Spine Complete; Future - methylPREDNISolone (MEDROL DOSEPAK) 4 MG TBPK tablet; Take as directed  Dispense: 21 tablet; Refill: 0 - DG Lumbar Spine Complete  Dorothyann Peng, NP

## 2019-08-12 ENCOUNTER — Other Ambulatory Visit: Payer: Self-pay | Admitting: Adult Health

## 2019-08-14 NOTE — Telephone Encounter (Signed)
Sent to the pharmacy by e-scribe. 

## 2019-10-13 ENCOUNTER — Other Ambulatory Visit: Payer: Self-pay | Admitting: Adult Health

## 2019-10-17 NOTE — Telephone Encounter (Signed)
Sent to the pharmacy by e-scribe. 

## 2019-10-22 ENCOUNTER — Other Ambulatory Visit: Payer: Self-pay | Admitting: Adult Health

## 2019-10-24 NOTE — Telephone Encounter (Signed)
Sent to the pharmacy by e-scribe. 

## 2019-12-06 DIAGNOSIS — S86012A Strain of left Achilles tendon, initial encounter: Secondary | ICD-10-CM | POA: Diagnosis not present

## 2019-12-27 ENCOUNTER — Ambulatory Visit: Payer: Medicare HMO | Attending: Internal Medicine

## 2019-12-27 DIAGNOSIS — Z23 Encounter for immunization: Secondary | ICD-10-CM

## 2019-12-27 NOTE — Progress Notes (Signed)
   Covid-19 Vaccination Clinic  Name:  JERMINE EADIE    MRN: [id number] DOB: Oct 07, 1946  12/27/2019  Mr. Quade was observed post Covid-19 immunization for 15 minutes without incidence. He was provided with Vaccine Information Sheet and instruction to access the V-Safe system.   Mr. Bausman was instructed to call 911 with any severe reactions post vaccine: Marland Kitchen Difficulty breathing  . Swelling of your face and throat  . A fast heartbeat  . A bad rash all over your body  . Dizziness and weakness    Immunizations Administered    Name Date Dose VIS Date Route   Pfizer COVID-19 Vaccine 12/27/2019  5:06 PM 0.3 mL 11/16/2019 Intramuscular   Manufacturer: [place]   Lot: [id number]   [place]: [id number]

## 2020-01-16 ENCOUNTER — Ambulatory Visit: Payer: Medicare HMO | Attending: Internal Medicine

## 2020-01-16 DIAGNOSIS — Z23 Encounter for immunization: Secondary | ICD-10-CM

## 2020-01-16 NOTE — Progress Notes (Signed)
   Covid-19 Vaccination Clinic  Name:  Edward Miles    MRN: [id number] DOB: 1946-10-18  01/16/2020  [name] was observed post Covid-19 immunization for 15 minutes without incidence. He was provided with Vaccine Information Sheet and instruction to access the V-Safe system.   [name] was instructed to call 911 with any severe reactions post vaccine: Marland Kitchen Difficulty breathing  . Swelling of your face and throat  . A fast heartbeat  . A bad rash all over your body  . Dizziness and weakness    Immunizations Administered    Name Date Dose VIS Date Route   Pfizer COVID-19 Vaccine 01/16/2020  9:59 AM 0.3 mL 11/16/2019 Intramuscular   Manufacturer: [employer]   Lot: [id number]   [place]: [id number]

## 2020-04-06 ENCOUNTER — Other Ambulatory Visit: Payer: Self-pay | Admitting: Adult Health

## 2020-04-07 DIAGNOSIS — M25572 Pain in left ankle and joints of left foot: Secondary | ICD-10-CM | POA: Diagnosis not present

## 2020-04-07 DIAGNOSIS — S86012A Strain of left Achilles tendon, initial encounter: Secondary | ICD-10-CM | POA: Diagnosis not present

## 2020-04-07 DIAGNOSIS — M79672 Pain in left foot: Secondary | ICD-10-CM | POA: Diagnosis not present

## 2020-04-09 NOTE — Telephone Encounter (Signed)
SENT TO THE PHARMACY BY E-SCRIBE. 

## 2020-04-18 DIAGNOSIS — S86012A Strain of left Achilles tendon, initial encounter: Secondary | ICD-10-CM | POA: Diagnosis not present

## 2020-04-25 DIAGNOSIS — S86012A Strain of left Achilles tendon, initial encounter: Secondary | ICD-10-CM | POA: Diagnosis not present

## 2020-05-02 DIAGNOSIS — R69 Illness, unspecified: Secondary | ICD-10-CM | POA: Diagnosis not present

## 2020-05-07 DIAGNOSIS — M79672 Pain in left foot: Secondary | ICD-10-CM | POA: Diagnosis not present

## 2020-05-12 DIAGNOSIS — M79672 Pain in left foot: Secondary | ICD-10-CM | POA: Diagnosis not present

## 2020-05-16 DIAGNOSIS — M79672 Pain in left foot: Secondary | ICD-10-CM | POA: Diagnosis not present

## 2020-05-19 DIAGNOSIS — M79672 Pain in left foot: Secondary | ICD-10-CM | POA: Diagnosis not present

## 2020-06-04 DIAGNOSIS — M79672 Pain in left foot: Secondary | ICD-10-CM | POA: Diagnosis not present

## 2020-06-06 DIAGNOSIS — S86012A Strain of left Achilles tendon, initial encounter: Secondary | ICD-10-CM | POA: Diagnosis not present

## 2020-06-06 DIAGNOSIS — M79672 Pain in left foot: Secondary | ICD-10-CM | POA: Diagnosis not present

## 2020-06-19 ENCOUNTER — Encounter: Payer: Medicare HMO | Admitting: Adult Health

## 2020-07-03 ENCOUNTER — Other Ambulatory Visit: Payer: Self-pay | Admitting: Adult Health

## 2020-07-03 NOTE — Telephone Encounter (Signed)
Sent to the pharmacy by e-scribe for 90 days.  Pt has upcoming cpx. 

## 2020-07-07 DIAGNOSIS — H52223 Regular astigmatism, bilateral: Secondary | ICD-10-CM | POA: Diagnosis not present

## 2020-07-09 ENCOUNTER — Ambulatory Visit (INDEPENDENT_AMBULATORY_CARE_PROVIDER_SITE_OTHER): Payer: Medicare HMO | Admitting: Adult Health

## 2020-07-09 ENCOUNTER — Other Ambulatory Visit: Payer: Self-pay

## 2020-07-09 ENCOUNTER — Other Ambulatory Visit: Payer: Self-pay | Admitting: Adult Health

## 2020-07-09 ENCOUNTER — Encounter: Payer: Self-pay | Admitting: Adult Health

## 2020-07-09 VITALS — BP 120/80 | Temp 97.9°F | Ht 71.0 in | Wt 229.0 lb

## 2020-07-09 DIAGNOSIS — Z1159 Encounter for screening for other viral diseases: Secondary | ICD-10-CM | POA: Diagnosis not present

## 2020-07-09 DIAGNOSIS — Z0000 Encounter for general adult medical examination without abnormal findings: Secondary | ICD-10-CM | POA: Diagnosis not present

## 2020-07-09 DIAGNOSIS — N401 Enlarged prostate with lower urinary tract symptoms: Secondary | ICD-10-CM

## 2020-07-09 DIAGNOSIS — I10 Essential (primary) hypertension: Secondary | ICD-10-CM

## 2020-07-09 DIAGNOSIS — E039 Hypothyroidism, unspecified: Secondary | ICD-10-CM

## 2020-07-09 DIAGNOSIS — R351 Nocturia: Secondary | ICD-10-CM

## 2020-07-09 DIAGNOSIS — R0989 Other specified symptoms and signs involving the circulatory and respiratory systems: Secondary | ICD-10-CM

## 2020-07-09 DIAGNOSIS — Z131 Encounter for screening for diabetes mellitus: Secondary | ICD-10-CM | POA: Diagnosis not present

## 2020-07-09 MED ORDER — ROSUVASTATIN CALCIUM 10 MG PO TABS: 10.0000 mg | ORAL_TABLET | Freq: Every day | ORAL | 3 refills | Status: DC

## 2020-07-09 NOTE — Progress Notes (Signed)
Subjective:    Patient ID: Edward Miles, male    DOB: 25-Apr-1946, 74 y.o.   MRN: [id number]  HPI Patient presents for yearly preventative medicine examination. He is a pleasant 74 year old male who  has a past medical history of Colon polyps, Diverticulosis, ED (erectile dysfunction), Hypertension, Hypothyroidism, and Obese.  Essential Hypertension -currently prescribed atenolol 25 mg a day and chlorthalidone 0.5 mg daily.  He denies dizziness, lightheadedness, chest pain, shortness of breath, or syncopal episodes  BP Readings from Last 3 Encounters:  07/09/20 120/80  06/20/19 118/84  09/07/18 124/74   Hypothyroidism, has been well controlled in the past with Synthroid 50 mcg daily  H/o of left carotid bruit -had vascular ultrasound done in 2019 which showed a small blockage of 1 to 39%. He has never been on a statin for preventative measures   All immunizations and health maintenance protocols were reviewed with the patient and needed orders were placed.  Appropriate screening laboratory values were ordered for the patient including screening of hyperlipidemia, renal function and hepatic function. If indicated by BPH, a PSA was ordered.  Medication reconciliation,  past medical history, social history, problem list and allergies were reviewed in detail with the patient  Goals were established with regard to weight loss, exercise, and  diet in compliance with medications.  He eats a heart healthy diet and enjoys hiking and walking.  Wt Readings from Last 3 Encounters:  07/09/20 229 lb (103.9 kg)  06/20/19 228 lb (103.4 kg)  09/07/18 230 lb 3.2 oz (104.4 kg)   He is up-to-date on team colon cancer screening   Review of Systems  Constitutional: Negative.   HENT: Negative.   Eyes: Negative.   Respiratory: Negative.   Cardiovascular: Negative.   Gastrointestinal: Negative.   Endocrine: Negative.   Genitourinary: Negative.   Musculoskeletal: Positive for arthralgias  and back pain.  Skin: Negative.   Allergic/Immunologic: Negative.   Neurological: Negative.   Hematological: Negative.   Psychiatric/Behavioral: Negative.   All other systems reviewed and are negative.  Past Medical History:  Diagnosis Date   Colon polyps    Diverticulosis    ED (erectile dysfunction)    Hypertension    Hypothyroidism    Obese     Social History   Socioeconomic History   Marital status: Married    Spouse name: Not on file   Number of children: Not on file   Years of education: Not on file   Highest education level: Not on file  Occupational History   Not on file  Tobacco Use   Smoking status: Never Smoker   Smokeless tobacco: Never Used  Substance and Sexual Activity   Alcohol use: No   Drug use: No   Sexual activity: Not on file  Other Topics Concern   Not on file  Social History Narrative   Geophysical data processor    Married    Social Determinants of Health   Financial Resource Strain:    Difficulty of Paying Living Expenses:   Food Insecurity:    Worried About Charity fundraiser in the Last Year:    Arboriculturist in the Last Year:   Transportation Needs:    Film/video editor (Medical):    Lack of Transportation (Non-Medical):   Physical Activity:    Days of Exercise per Week:    Minutes of Exercise per Session:   Stress:    Feeling of Stress :   Social Connections:  Frequency of Communication with Friends and Family:    Frequency of Social Gatherings with Friends and Family:    Attends Religious Services:    Active Member of Clubs or Organizations:    Attends Music therapist:    Marital Status:   Intimate Partner Violence:    Fear of Current or Ex-Partner:    Emotionally Abused:    Physically Abused:    Sexually Abused:     Past Surgical History:  Procedure Laterality Date   CHOLECYSTECTOMY      Family History  Problem Relation Age of Onset   Heart failure  Mother    Hypertension Mother    Hypothyroidism Mother    Hodgkin's lymphoma Sister    Hypothyroidism Maternal Grandmother    Colon cancer Neg Hx    Stomach cancer Neg Hx     No Known Allergies  Current Outpatient Medications on File Prior to Visit  Medication Sig Dispense Refill   aspirin 81 MG tablet Take 81 mg by mouth daily.       atenolol (TENORMIN) 25 MG tablet TAKE 1 TABLET BY MOUTH EVERY DAY 90 tablet 0   chlorthalidone (HYGROTON) 25 MG tablet TAKE 1/2 TABLET BY MOUTH EVERY DAY 45 tablet 0   levothyroxine (SYNTHROID) 50 MCG tablet TAKE 1 TABLET BY MOUTH EVERY DAY 90 tablet 3   sildenafil (REVATIO) 20 MG tablet Take 1 to 2 tabs 2 - 3 hours before sex (Patient not taking: Reported on 07/09/2020) 30 tablet 11   No current facility-administered medications on file prior to visit.    BP 120/80    Temp 97.9 F (36.6 C)    Ht 5\' 11"  (1.803 m)    Wt 229 lb (103.9 kg)    BMI 31.94 kg/m       Objective:   Physical Exam Vitals and nursing note reviewed.  Constitutional:      General: He is not in acute distress.    Appearance: Normal appearance. He is well-developed and normal weight.  HENT:     Head: Normocephalic and atraumatic.     Right Ear: Tympanic membrane, ear canal and external ear normal. There is no impacted cerumen.     Left Ear: Tympanic membrane, ear canal and external ear normal. There is no impacted cerumen.     Nose: Nose normal. No congestion or rhinorrhea.     Mouth/Throat:     Mouth: Mucous membranes are moist.     Pharynx: Oropharynx is clear. No oropharyngeal exudate or posterior oropharyngeal erythema.  Eyes:     General:        Right eye: No discharge.        Left eye: No discharge.     Extraocular Movements: Extraocular movements intact.     Conjunctiva/sclera: Conjunctivae normal.     Pupils: Pupils are equal, round, and reactive to light.  Neck:     Vascular: Carotid bruit (left) present.     Trachea: No tracheal deviation.    Cardiovascular:     Rate and Rhythm: Normal rate and regular rhythm.     Pulses: Normal pulses.     Heart sounds: Normal heart sounds. No murmur heard.  No friction rub. No gallop.   Pulmonary:     Effort: Pulmonary effort is normal. No respiratory distress.     Breath sounds: Normal breath sounds. No stridor. No wheezing, rhonchi or rales.  Chest:     Chest wall: No tenderness.  Abdominal:     General:  Bowel sounds are normal. There is no distension.     Palpations: Abdomen is soft. There is no mass.     Tenderness: There is no abdominal tenderness. There is no right CVA tenderness, left CVA tenderness, guarding or rebound.     Hernia: No hernia is present.  Musculoskeletal:        General: No swelling, tenderness, deformity or signs of injury. Normal range of motion.     Right lower leg: No edema.     Left lower leg: No edema.  Lymphadenopathy:     Cervical: No cervical adenopathy.  Skin:    General: Skin is warm and dry.     Capillary Refill: Capillary refill takes less than 2 seconds.     Coloration: Skin is not jaundiced or pale.     Findings: No bruising, erythema, lesion or rash.  Neurological:     General: No focal deficit present.     Mental Status: He is alert and oriented to person, place, and time.     Cranial Nerves: No cranial nerve deficit.     Sensory: No sensory deficit.     Motor: No weakness.     Coordination: Coordination normal.     Gait: Gait normal.     Deep Tendon Reflexes: Reflexes normal.  Psychiatric:        Mood and Affect: Mood normal.        Behavior: Behavior normal.        Thought Content: Thought content normal.        Judgment: Judgment normal.       Assessment & Plan:  1. Routine general medical examination at a health care facility - Follow up in one year or sooner if needed  - Continue to stay active and eat healthy  - CBC with Differential/Platelet; Future - Comprehensive metabolic panel; Future - Hemoglobin A1c; Future - Lipid  panel; Future - TSH; Future  2. BPH associated with nocturia  - PSA; Future  3. Hypothyroidism, unspecified type - Consider synthroid change  - CBC with Differential/Platelet; Future - Comprehensive metabolic panel; Future - Hemoglobin A1c; Future - Lipid panel; Future - TSH; Future  4. Essential hypertension - Well controlled.  - No change in medications  - CBC with Differential/Platelet; Future - Comprehensive metabolic panel; Future - Hemoglobin A1c; Future - Lipid panel; Future - TSH; Future  5. Left carotid bruit - Will start on Crestor 10 mg to see if we can get some regression of carotid artery stenosis over the next year  - Lipid panel; Future - US Carotid Duplex Bilateral; Future - rosuvastatin (CRESTOR) 10 MG tablet; Take 1 tablet (10 mg total) by mouth daily.  Dispense: 90 tablet; Refill: 3  6. Encounter for hepatitis C screening test for low risk patient  - Hep C Antibody; Future   Dorothyann Peng, NP

## 2020-07-09 NOTE — Patient Instructions (Signed)
It was great seeing you today   We will follow up with you regarding your blood work and someone will call you to schedule your ultrasound   I will see you back in a year or sooner if needed

## 2020-07-10 ENCOUNTER — Other Ambulatory Visit: Payer: Self-pay | Admitting: Adult Health

## 2020-07-10 DIAGNOSIS — R7989 Other specified abnormal findings of blood chemistry: Secondary | ICD-10-CM

## 2020-07-10 LAB — CBC WITH DIFFERENTIAL/PLATELET
Absolute Monocytes: 454 cells/uL (ref 200–950)
Basophils Absolute: 82 cells/uL (ref 0–200)
Basophils Relative: 1.6 %
Eosinophils Absolute: 352 cells/uL (ref 15–500)
Eosinophils Relative: 6.9 %
HCT: 48 % (ref 38.5–50.0)
Hemoglobin: 17.1 g/dL (ref 13.2–17.1)
Lymphs Abs: 1520 cells/uL (ref 850–3900)
MCH: 33.9 pg — ABNORMAL HIGH (ref 27.0–33.0)
MCHC: 35.6 g/dL (ref 32.0–36.0)
MCV: 95 fL (ref 80.0–100.0)
MPV: 12.7 fL — ABNORMAL HIGH (ref 7.5–12.5)
Monocytes Relative: 8.9 %
Neutro Abs: 2693 cells/uL (ref 1500–7800)
Neutrophils Relative %: 52.8 %
Platelets: 176 10*3/uL (ref 140–400)
RBC: 5.05 10*6/uL (ref 4.20–5.80)
RDW: 12.2 % (ref 11.0–15.0)
Total Lymphocyte: 29.8 %
WBC: 5.1 10*3/uL (ref 3.8–10.8)

## 2020-07-10 LAB — LIPID PANEL
Cholesterol: 166 mg/dL (ref <200)
HDL: 61 mg/dL (ref >=40)
LDL Cholesterol (Calc): 86 mg/dL (calc)
Non-HDL Cholesterol (Calc): 105 mg/dL (calc) (ref <130)
Total CHOL/HDL Ratio: 2.7 (calc) (ref <5.0)
Triglycerides: 98 mg/dL (ref <150)

## 2020-07-10 LAB — COMPREHENSIVE METABOLIC PANEL
AG Ratio: 1.4 (calc) (ref 1.0–2.5)
ALT: 35 U/L (ref 9–46)
AST: 26 U/L (ref 10–35)
Albumin: 4.2 g/dL (ref 3.6–5.1)
Alkaline phosphatase (APISO): 66 U/L (ref 35–144)
BUN: 18 mg/dL (ref 7–25)
CO2: 27 mmol/L (ref 20–32)
Calcium: 9.5 mg/dL (ref 8.6–10.3)
Chloride: 101 mmol/L (ref 98–110)
Creat: 0.89 mg/dL (ref 0.70–1.18)
Globulin: 2.9 g/dL (calc) (ref 1.9–3.7)
Glucose, Bld: 104 mg/dL — ABNORMAL HIGH (ref 65–99)
Potassium: 3.9 mmol/L (ref 3.5–5.3)
Sodium: 138 mmol/L (ref 135–146)
Total Bilirubin: 0.9 mg/dL (ref 0.2–1.2)
Total Protein: 7.1 g/dL (ref 6.1–8.1)

## 2020-07-10 LAB — PSA: PSA: 1.8 ng/mL (ref <=4.0)

## 2020-07-10 LAB — HEPATITIS C ANTIBODY
Hepatitis C Ab: NONREACTIVE
SIGNAL TO CUT-OFF: 0.03 (ref <1.00)

## 2020-07-10 LAB — TSH: TSH: 5.52 mIU/L — ABNORMAL HIGH (ref 0.40–4.50)

## 2020-07-10 LAB — HEMOGLOBIN A1C
Hgb A1c MFr Bld: 5.6 % of total Hgb (ref <5.7)
Mean Plasma Glucose: 114 (calc)
eAG (mmol/L): 6.3 (calc)

## 2020-07-16 ENCOUNTER — Ambulatory Visit
Admission: RE | Admit: 2020-07-16 | Discharge: 2020-07-16 | Disposition: A | Discharge status: Home / Self Care | Payer: Medicare HMO | Source: Ambulatory Visit | Attending: Adult Health | Admitting: Adult Health

## 2020-07-16 DIAGNOSIS — I6523 Occlusion and stenosis of bilateral carotid arteries: Secondary | ICD-10-CM | POA: Diagnosis not present

## 2020-07-16 DIAGNOSIS — I771 Stricture of artery: Secondary | ICD-10-CM | POA: Diagnosis not present

## 2020-07-16 DIAGNOSIS — R0989 Other specified symptoms and signs involving the circulatory and respiratory systems: Secondary | ICD-10-CM

## 2020-07-18 ENCOUNTER — Telehealth: Payer: Self-pay | Admitting: Adult Health

## 2020-07-18 NOTE — Telephone Encounter (Signed)
The patient was returning Misty's call for results.

## 2020-07-22 NOTE — Telephone Encounter (Signed)
See result note.  Pt notified of results.  Nothing further needed.

## 2020-08-13 ENCOUNTER — Other Ambulatory Visit: Payer: Self-pay

## 2020-08-13 ENCOUNTER — Other Ambulatory Visit: Payer: Medicare HMO

## 2020-08-13 DIAGNOSIS — R7989 Other specified abnormal findings of blood chemistry: Secondary | ICD-10-CM | POA: Diagnosis not present

## 2020-08-13 DIAGNOSIS — E039 Hypothyroidism, unspecified: Secondary | ICD-10-CM | POA: Diagnosis not present

## 2020-08-13 LAB — T3, FREE: T3, Free: 2.9 pg/mL (ref 2.3–4.2)

## 2020-08-13 LAB — T4, FREE: Free T4: 1.1 ng/dL (ref 0.8–1.8)

## 2020-08-13 LAB — TSH: TSH: 5.46 mIU/L — ABNORMAL HIGH (ref 0.40–4.50)

## 2020-08-15 DIAGNOSIS — H25043 Posterior subcapsular polar age-related cataract, bilateral: Secondary | ICD-10-CM | POA: Diagnosis not present

## 2020-08-15 DIAGNOSIS — H25013 Cortical age-related cataract, bilateral: Secondary | ICD-10-CM | POA: Diagnosis not present

## 2020-08-15 DIAGNOSIS — H2512 Age-related nuclear cataract, left eye: Secondary | ICD-10-CM | POA: Diagnosis not present

## 2020-08-15 DIAGNOSIS — H18413 Arcus senilis, bilateral: Secondary | ICD-10-CM | POA: Diagnosis not present

## 2020-08-15 DIAGNOSIS — H2513 Age-related nuclear cataract, bilateral: Secondary | ICD-10-CM | POA: Diagnosis not present

## 2020-08-18 ENCOUNTER — Other Ambulatory Visit: Payer: Self-pay | Admitting: Adult Health

## 2020-09-28 ENCOUNTER — Other Ambulatory Visit: Payer: Self-pay | Admitting: Adult Health

## 2020-10-14 IMAGING — US US CAROTID DUPLEX BILAT
1 series · 13 of 24 positions shown · non-contrast
Comparison: None.

CLINICAL DATA: 74-year-old male with a history of left carotid
bruit

EXAM:
BILATERAL CAROTID DUPLEX ULTRASOUND
TECHNIQUE: Gray scale imaging, color Doppler and duplex ultrasound were
performed of bilateral carotid and vertebral arteries in the neck.

[Series 1: us carotid duplex bilat · 0.05mm/px · 13 of 75 slices shown]
[im 1/75]
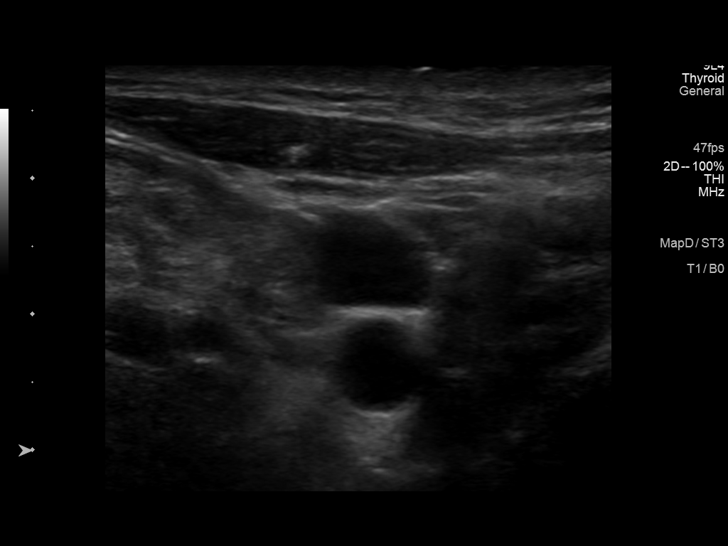
[im 7/75]
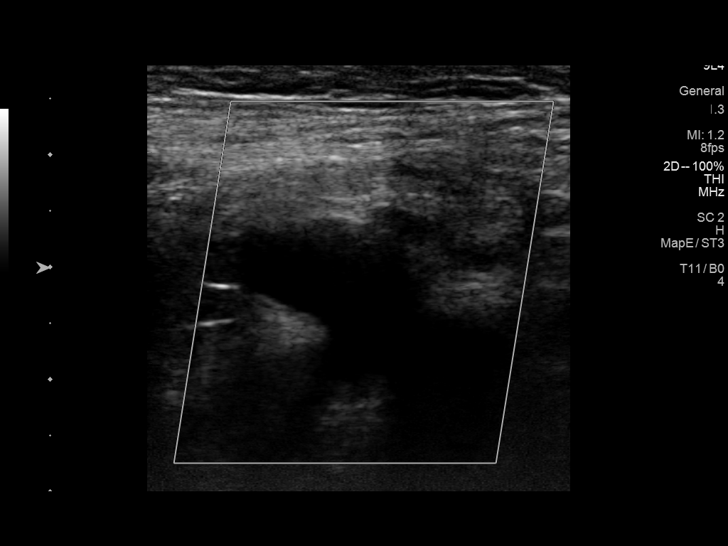
[im 13/75]
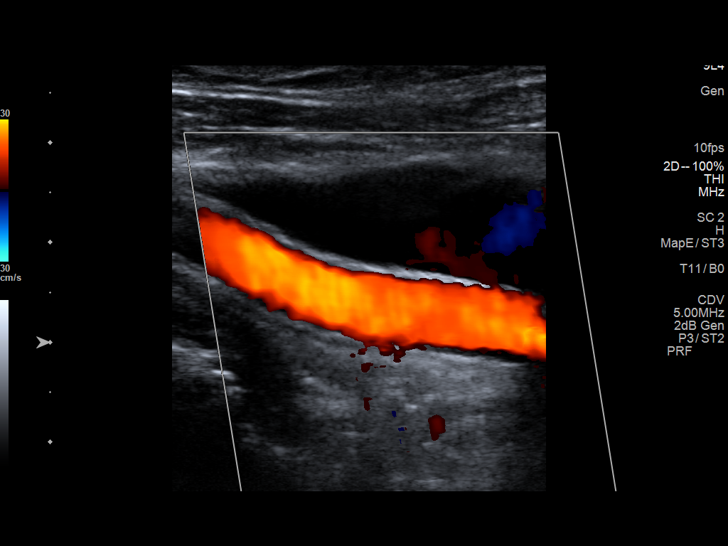
[im 20/75]
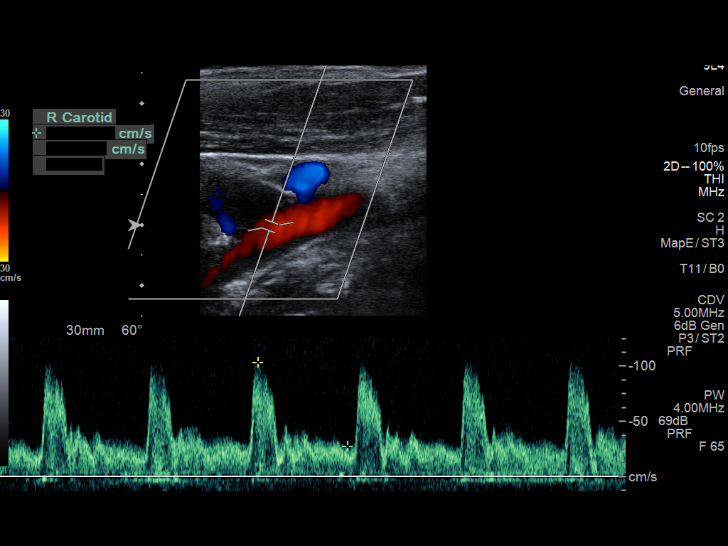
[im 26/75]
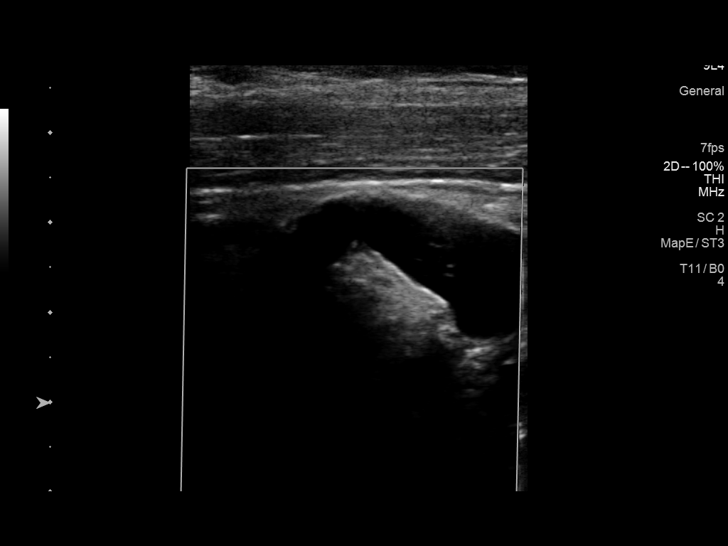
[im 33/75]
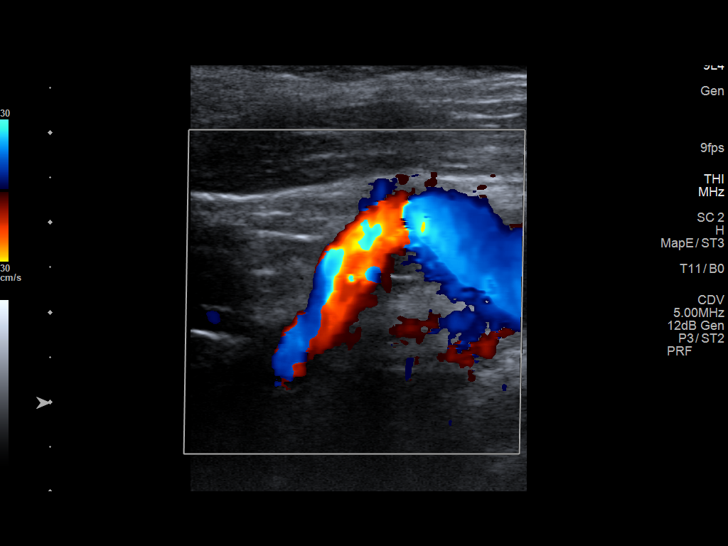
[im 39/75]
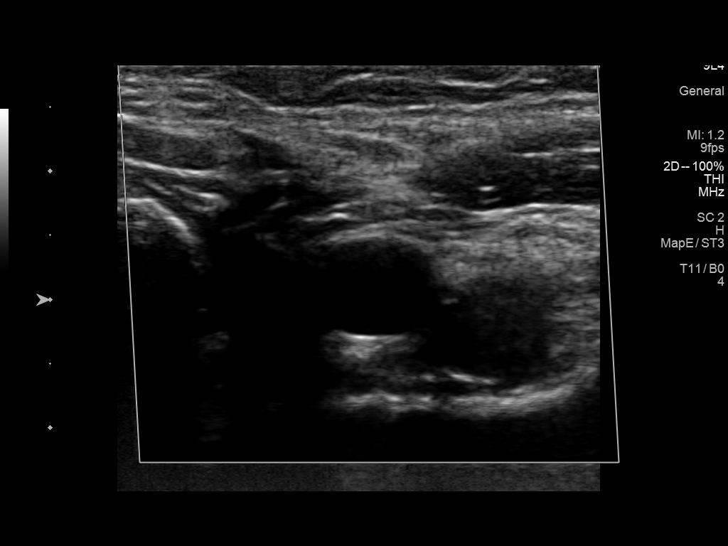
[im 42/75]
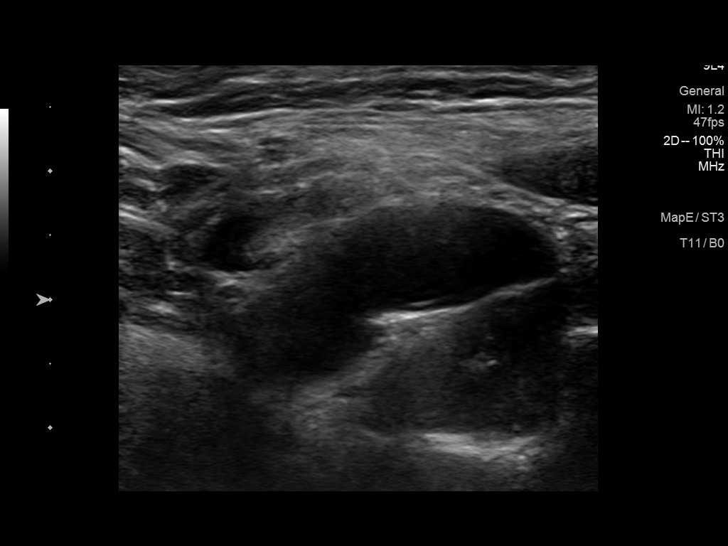
[im 49/75]
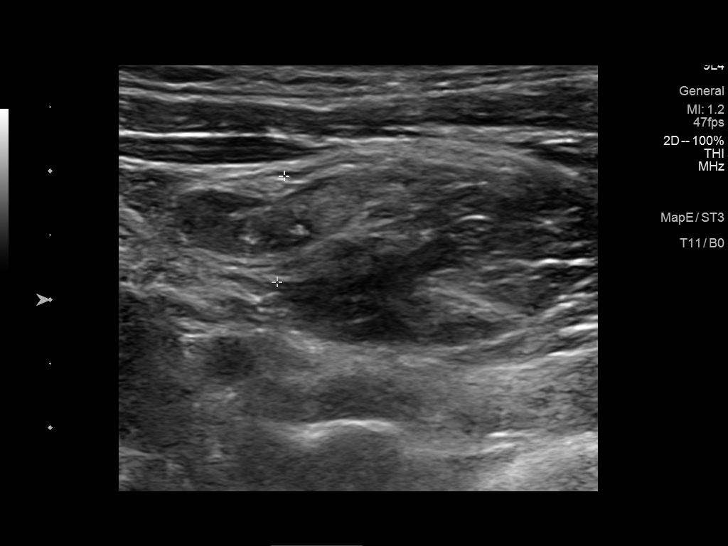
[im 55/75]
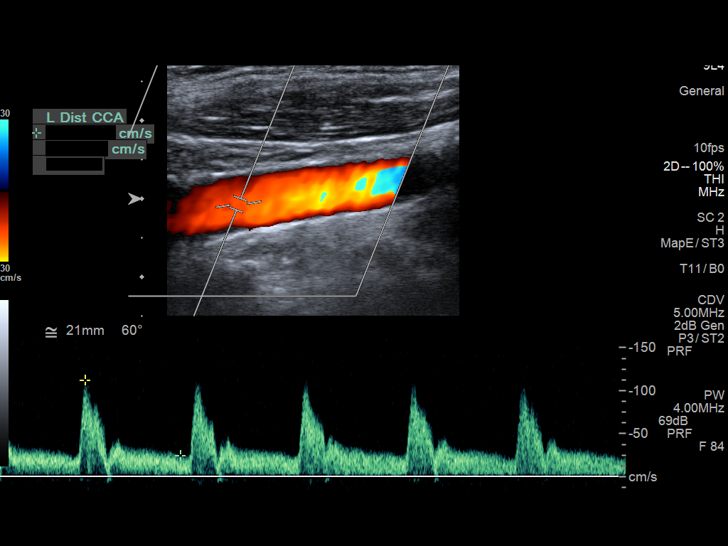
[im 62/75]
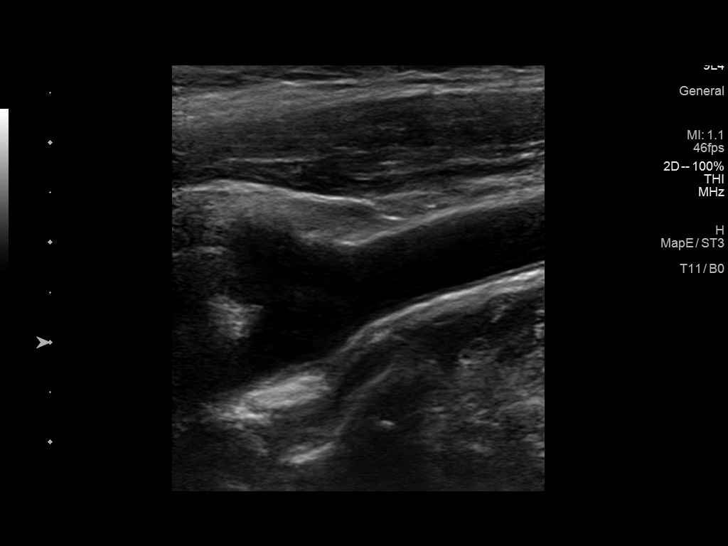
[im 68/75]
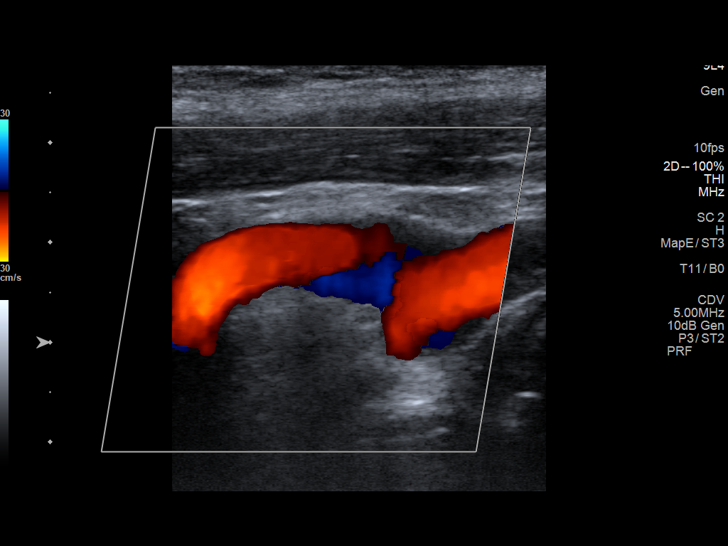
[im 75/75]
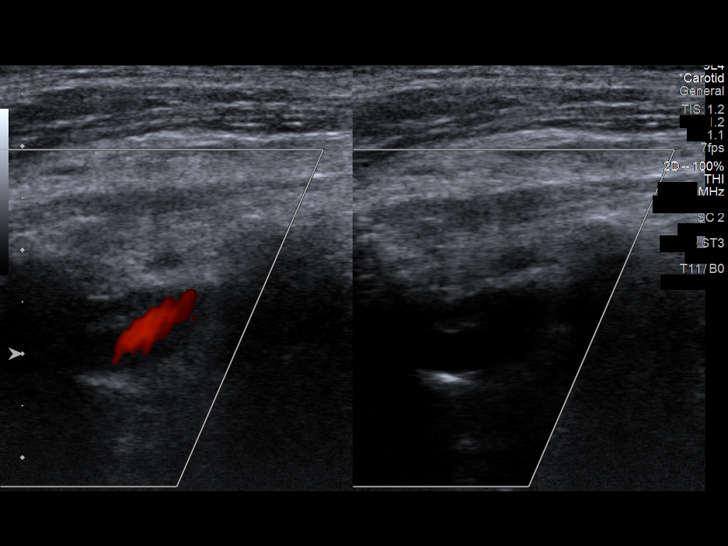

[13 of 24 positions shown; findings below may reference images not displayed]

FINDINGS: Criteria: Quantification of carotid stenosis is based on velocity
parameters that correlate the residual internal carotid diameter
with NASCET-based stenosis levels, using the diameter of the distal
internal carotid lumen as the denominator for stenosis measurement.

The following velocity measurements were obtained:

RIGHT

ICA:  Systolic 62 cm/sec, Diastolic 38 cm/sec

CCA:  152 cm/sec

SYSTOLIC ICA/CCA RATIO:

ECA:  82 cm/sec

LEFT

ICA:  Systolic 81 cm/sec, Diastolic 26 cm/sec

CCA:  126 cm/sec

SYSTOLIC ICA/CCA RATIO:

ECA:  118 cm/sec

Right Brachial SBP: 120

Left Brachial SBP: 124

RIGHT CAROTID ARTERY: No significant calcified disease of the right
common carotid artery. Intermediate waveform maintained.
Heterogeneous plaque without significant calcifications at the right
carotid bifurcation. Low resistance waveform of the right ICA.
Tortuosity

RIGHT VERTEBRAL ARTERY: Antegrade flow with low resistance waveform.

LEFT CAROTID ARTERY: No significant calcified disease of the left
common carotid artery. Intermediate waveform maintained.
Heterogeneous plaque at the left carotid bifurcation without
significant calcifications. Low resistance waveform of the left ICA.
Tortuosity

LEFT VERTEBRAL ARTERY:  Antegrade flow with low resistance waveform.
IMPRESSION: Color duplex indicates minimal heterogeneous plaque, with no
hemodynamically significant stenosis by duplex criteria in the
extracranial cerebrovascular circulation.

## 2020-10-20 DIAGNOSIS — H2512 Age-related nuclear cataract, left eye: Secondary | ICD-10-CM | POA: Diagnosis not present

## 2020-10-20 DIAGNOSIS — H52202 Unspecified astigmatism, left eye: Secondary | ICD-10-CM | POA: Diagnosis not present

## 2020-10-21 DIAGNOSIS — H2511 Age-related nuclear cataract, right eye: Secondary | ICD-10-CM | POA: Diagnosis not present

## 2020-12-15 DIAGNOSIS — H52201 Unspecified astigmatism, right eye: Secondary | ICD-10-CM | POA: Diagnosis not present

## 2020-12-15 DIAGNOSIS — H2511 Age-related nuclear cataract, right eye: Secondary | ICD-10-CM | POA: Diagnosis not present

## 2021-01-04 ENCOUNTER — Other Ambulatory Visit: Payer: Self-pay | Admitting: Adult Health

## 2021-01-06 NOTE — Telephone Encounter (Signed)
Sent to the pharmacy by e-scribe. 

## 2021-01-15 DIAGNOSIS — H26492 Other secondary cataract, left eye: Secondary | ICD-10-CM | POA: Diagnosis not present

## 2021-01-16 ENCOUNTER — Ambulatory Visit (INDEPENDENT_AMBULATORY_CARE_PROVIDER_SITE_OTHER): Payer: Medicare HMO

## 2021-01-16 DIAGNOSIS — Z0000 Encounter for general adult medical examination without abnormal findings: Secondary | ICD-10-CM

## 2021-01-16 NOTE — Progress Notes (Signed)
Subjective:   Edward Miles is a 75 y.o. male who presents for an Initial Medicare Annual Wellness Visit.  Virtual Visit via Video Note  I connected with Edward Miles by a video enabled telemedicine application and verified that I am speaking with the correct person using two identifiers.  Location: Patient: Home Provider: Office Persons participating in the virtual visit: patient, provider   I discussed the limitations of evaluation and management by telemedicine and the availability of in person appointments. The patient expressed understanding and agreed to proceed.     Edward Beach Demond Shallenberger,LPN    Review of Systems    N/A  Cardiac Risk Factors include: advanced age (>43men, >51 women);male gender     Objective:    Today's Vitals   There is no height or weight on file to calculate BMI.  Advanced Directives 01/16/2021  Does Patient Have a Medical Advance Directive? Yes  Type of Paramedic of [place];Living will  Does patient want to make changes to medical advance directive? No - Patient declined  Copy of [place] in Chart? No - copy requested    Current Medications (verified) Outpatient Encounter Medications as of 01/16/2021  Medication Sig  . aspirin 81 MG tablet Take 81 mg by mouth daily.  Marland Kitchen atenolol (TENORMIN) 25 MG tablet TAKE 1 TABLET BY MOUTH EVERY DAY  . chlorthalidone (HYGROTON) 25 MG tablet TAKE 1/2 TABLET BY MOUTH EVERY DAY  . levothyroxine (SYNTHROID) 50 MCG tablet TAKE 1 TABLET BY MOUTH EVERY DAY  . rosuvastatin (CRESTOR) 10 MG tablet Take 1 tablet (10 mg total) by mouth daily. (Patient not taking: Reported on 01/16/2021)  . sildenafil (REVATIO) 20 MG tablet Take 1 to 2 tabs 2 - 3 hours before sex (Patient not taking: No sig reported)   No facility-administered encounter medications on file as of 01/16/2021.    Allergies (verified) Patient has no known allergies.   History: Past Medical History:   Diagnosis Date  . Colon polyps   . Diverticulosis   . ED (erectile dysfunction)   . Hypertension   . Hypothyroidism   . Obese    Past Surgical History:  Procedure Laterality Date  . CATARACT EXTRACTION, BILATERAL Bilateral   . CHOLECYSTECTOMY     Family History  Problem Relation Age of Onset  . Heart failure Mother   . Hypertension Mother   . Hypothyroidism Mother   . Hodgkin's lymphoma Sister   . Hypothyroidism Maternal Grandmother   . Colon cancer Neg Hx   . Stomach cancer Neg Hx    Social History   Socioeconomic History  . Marital status: Married    Spouse name: Not on file  . Number of children: Not on file  . Years of education: Not on file  . Highest education level: Not on file  Occupational History  . Not on file  Tobacco Use  . Smoking status: Never Smoker  . Smokeless tobacco: Never Used  Substance and Sexual Activity  . Alcohol use: No  . Drug use: No  . Sexual activity: Not on file  Other Topics Concern  . Not on file  Social History Narrative   Geophysical data processor    Married    Social Determinants of Health   Financial Resource Strain: Low Risk   . Difficulty of Paying Living Expenses: Not hard at all  Food Insecurity: No Food Insecurity  . Worried About Charity fundraiser in the Last Year: Never true  . Ran  Out of Food in the Last Year: Never true  Transportation Needs: No Transportation Needs  . Lack of Transportation (Medical): No  . Lack of Transportation (Non-Medical): No  Physical Activity: Inactive  . Days of Exercise per Week: 0 days  . Minutes of Exercise per Session: 0 min  Stress: No Stress Concern Present  . Feeling of Stress : Not at all  Social Connections: Moderately Integrated  . Frequency of Communication with Friends and Family: Three times a week  . Frequency of Social Gatherings with Friends and Family: Once a week  . Attends Religious Services: Never  . Active Member of Clubs or Organizations: Yes  . Attends  Archivist Meetings: 1 to 4 times per year  . Marital Status: Married    Tobacco Counseling Counseling given: Not Answered   Clinical Intake:  Pre-visit preparation completed: Yes  Pain : No/denies pain     Nutritional Risks: None Diabetes: No  How often do you need to have someone help you when you read instructions, pamphlets, or other written materials from your doctor or pharmacy?: 1 - Never  Diabetic?No      Information entered by :: [place] of Daily Living In your present state of health, do you have any difficulty performing the following activities: 01/16/2021  Hearing? N  Vision? N  Difficulty concentrating or making decisions? N  Walking or climbing stairs? N  Dressing or bathing? N  Doing errands, shopping? N  Preparing Food and eating ? N  Using the Toilet? N  In the past six months, have you accidently leaked urine? N  Do you have problems with loss of bowel control? N  Managing your Medications? N  Managing your Finances? N  Housekeeping or managing your Housekeeping? N  Some recent data might be hidden    Patient Care Team: Edward Peng, NP as PCP - General (Family Medicine) [facility] as Consulting Physician (Orthopedic Surgery)  Indicate any recent Medical Services you may have received from other than [facility] providers in the past year (date may be approximate).     Assessment:   This is a routine wellness examination for Edward Miles.  Hearing/Vision screen  Hearing Screening   125Hz  250Hz  500Hz  1000Hz  2000Hz  3000Hz  4000Hz  6000Hz  8000Hz   Right ear:           Left ear:           Vision Screening Comments: Gets eyes exams once per year. Wears reading glasses.   Dietary issues and exercise activities discussed: Current Exercise Habits: The patient does not participate in regular exercise at present  Goals    . Exercise 3x per week (30 min per time)      Depression Screen PHQ 2/9 Scores 01/16/2021 07/09/2020  09/07/2018 03/14/2017 03/08/2016 12/12/2014 11/22/2013  PHQ - 2 Score 0 0 0 0 0 0 0    Fall Risk Fall Risk  01/16/2021 07/09/2020 09/07/2018 03/14/2017 03/08/2016  Falls in the past year? 0 1 No No No  Number falls in past yr: 0 0 - - -  Injury with Fall? 0 - - - -  Risk for fall due to : No Fall Risks - - - -  Follow up Falls evaluation completed;Falls prevention discussed - - - -    FALL RISK PREVENTION PERTAINING TO THE HOME:  Any stairs in or around the home? No  If so, are there any without handrails? No  Home free of loose throw rugs in walkways, pet beds,  electrical cords, etc? Yes  Adequate lighting in your home to reduce risk of falls? Yes   ASSISTIVE DEVICES UTILIZED TO PREVENT FALLS:  Life alert? No  Use of a cane, walker or w/c? No  Grab bars in the bathroom? Yes  Shower chair or bench in shower? Yes  Elevated toilet seat or a handicapped toilet? No     Cognitive Function:   Normal cognitive status assessed by direct observation by this Nurse Health Advisor. No abnormalities found.        Immunizations Immunization History  Administered Date(s) Administered  . Influenza Split 09/14/2011  . Influenza Whole 08/03/2010  . Influenza, High Dose Seasonal PF 12/12/2014, 09/07/2018  . Influenza, Seasonal, Injecte, Preservative Fre 11/21/2012  . Influenza,inj,Quad PF,6+ Mos 11/22/2013  . PFIZER(Purple Top)SARS-COV-2 Vaccination 12/27/2019, 01/16/2020, 09/19/2020  . Pneumococcal Conjugate-13 11/22/2013  . Pneumococcal Polysaccharide-23 03/08/2016  . Td 03/21/2006  . Tdap 11/22/2013  . Zoster 09/14/2011    TDAP status: Up to date  Flu Vaccine status: Due, Education has been provided regarding the importance of this vaccine. Advised may receive this vaccine at local pharmacy or Health Dept. Aware to provide a copy of the vaccination record if obtained from local pharmacy or Health Dept. Verbalized acceptance and understanding.  Pneumococcal vaccine status: Up to  date  Covid-19 vaccine status: Completed vaccines  Qualifies for Shingles Vaccine? Yes   Zostavax completed Yes   Shingrix Completed?: No.    Education has been provided regarding the importance of this vaccine. Patient has been advised to call insurance company to determine out of pocket expense if they have not yet received this vaccine. Advised may also receive vaccine at local pharmacy or Health Dept. Verbalized acceptance and understanding.  Screening Tests Health Maintenance  Topic Date Due  . INFLUENZA VACCINE  07/06/2020  . COVID-19 Vaccine (4 - Booster for Pfizer series) 03/20/2021  . TETANUS/TDAP  11/23/2023  . COLONOSCOPY (Pts 45-12yrs Insurance coverage will need to be confirmed)  01/12/2024  . Hepatitis C Screening  Completed  . PNA vac Low Risk Adult  Completed    Health Maintenance  Health Maintenance Due  Topic Date Due  . INFLUENZA VACCINE  07/06/2020    Colorectal cancer screening: Type of screening: Colonoscopy. Completed 01/11/2014. Repeat every 10 years  Lung Cancer Screening: (Low Dose CT Chest recommended if Age 74-80 years, 30 pack-year currently smoking OR have quit w/in 15years.) does not qualify.   Lung Cancer Screening Referral: N/A   Additional Screening:  Hepatitis C Screening: does qualify; Completed 07/09/2020   Vision Screening: Recommended annual ophthalmology exams for early detection of glaucoma and other disorders of the eye. Is the patient up to date with their annual eye exam?  Yes  Who is the provider or what is the name of the office in which the patient attends annual eye exams? Dr. Sherral Hammers  If pt is not established with a provider, would they like to be referred to a provider to establish care? No .   Dental Screening: Recommended annual dental exams for proper oral hygiene  Community Resource Referral / Chronic Care Management: CRR required this visit?  No   CCM required this visit?  No      Plan:     I have personally  reviewed and noted the following in the patient's chart:   . Medical and social history . Use of alcohol, tobacco or illicit drugs  . Current medications and supplements . Functional ability and status . Nutritional status .  Physical activity . Advanced directives . List of other physicians . Hospitalizations, surgeries, and ER visits in previous 12 months . Vitals . Screenings to include cognitive, depression, and falls . Referrals and appointments  In addition, I have reviewed and discussed with patient certain preventive protocols, quality metrics, and best practice recommendations. A written personalized care plan for preventive services as well as general preventive health recommendations were provided to patient.     Ofilia Neas, LPN   05/03/4131   Nurse Notes: None

## 2021-01-16 NOTE — Patient Instructions (Signed)
[name] , Thank you for taking time to come for your Medicare Wellness Visit. I appreciate your ongoing commitment to your health goals. Please review the following plan we discussed and let me know if I can assist you in the future.   Screening recommendations/referrals: Colonoscopy: Up to date next due 01/12/2024 Recommended yearly ophthalmology/optometry visit for glaucoma screening and checkup Recommended yearly dental visit for hygiene and checkup  Vaccinations: Influenza vaccine: Currently due, if you wish to receive you may do at our office or your local pharmacy. Pneumococcal vaccine: Completed series  Tdap vaccine: Up to date, next due 11/23/2023 Shingles vaccine: Currently due for Shingrix, if you wish to receive we recommend that you do so at your local pharmacy as it is less expensive     Advanced directives: Please bring copies of your advanced medical directives so that we may scan into your chart   Conditions/risks identified: None   Next appointment: None   Preventive Care 65 Years and Older, Male Preventive care refers to lifestyle choices and visits with your health care provider that can promote health and wellness. What does preventive care include?  A yearly physical exam. This is also called an annual well check.  Dental exams once or twice a year.  Routine eye exams. Ask your health care provider how often you should have your eyes checked.  Personal lifestyle choices, including:  Daily care of your teeth and gums.  Regular physical activity.  Eating a healthy diet.  Avoiding tobacco and drug use.  Limiting alcohol use.  Practicing safe sex.  Taking low doses of aspirin every day.  Taking vitamin and mineral supplements as recommended by your health care provider. What happens during an annual well check? The services and screenings done by your health care provider during your annual well check will depend on your age, overall health,  lifestyle risk factors, and family history of disease. Counseling  Your health care provider may ask you questions about your:  Alcohol use.  Tobacco use.  Drug use.  Emotional well-being.  Home and relationship well-being.  Sexual activity.  Eating habits.  History of falls.  Memory and ability to understand (cognition).  Work and work Statistician. Screening  You may have the following tests or measurements:  Height, weight, and BMI.  Blood pressure.  Lipid and cholesterol levels. These may be checked every 5 years, or more frequently if you are over 45 years old.  Skin check.  Lung cancer screening. You may have this screening every year starting at age 79 if you have a 30-pack-year history of smoking and currently smoke or have quit within the past 15 years.  Fecal occult blood test (FOBT) of the stool. You may have this test every year starting at age 5.  Flexible sigmoidoscopy or colonoscopy. You may have a sigmoidoscopy every 5 years or a colonoscopy every 10 years starting at age 73.  Prostate cancer screening. Recommendations will vary depending on your family history and other risks.  Hepatitis C blood test.  Hepatitis B blood test.  Sexually transmitted disease (STD) testing.  Diabetes screening. This is done by checking your blood sugar (glucose) after you have not eaten for a while (fasting). You may have this done every 1-3 years.  Abdominal aortic aneurysm (AAA) screening. You may need this if you are a current or former smoker.  Osteoporosis. You may be screened starting at age 26 if you are at high risk. Talk with your health care provider  about your test results, treatment options, and if necessary, the need for more tests. Vaccines  Your health care provider may recommend certain vaccines, such as:  Influenza vaccine. This is recommended every year.  Tetanus, diphtheria, and acellular pertussis (Tdap, Td) vaccine. You may need a Td booster  every 10 years.  Zoster vaccine. You may need this after age 38.  Pneumococcal 13-valent conjugate (PCV13) vaccine. One dose is recommended after age 35.  Pneumococcal polysaccharide (PPSV23) vaccine. One dose is recommended after age 67. Talk to your health care provider about which screenings and vaccines you need and how often you need them. This information is not intended to replace advice given to you by your health care provider. Make sure you discuss any questions you have with your health care provider. Document Released: 12/19/2015 Document Revised: 08/11/2016 Document Reviewed: 09/23/2015 Elsevier Interactive Patient Education  2017 [place] Prevention in the Home Falls can cause injuries. They can happen to people of all ages. There are many things you can do to make your home safe and to help prevent falls. What can I do on the outside of my home?  Regularly fix the edges of walkways and driveways and fix any cracks.  Remove anything that might make you trip as you walk through a door, such as a raised step or threshold.  Trim any bushes or trees on the path to your home.  Use bright outdoor lighting.  Clear any walking paths of anything that might make someone trip, such as rocks or tools.  Regularly check to see if handrails are loose or broken. Make sure that both sides of any steps have handrails.  Any raised decks and porches should have guardrails on the edges.  Have any leaves, snow, or ice cleared regularly.  Use sand or salt on walking paths during winter.  Clean up any spills in your garage right away. This includes oil or grease spills. What can I do in the bathroom?  Use night lights.  Install grab bars by the toilet and in the tub and shower. Do not use towel bars as grab bars.  Use non-skid mats or decals in the tub or shower.  If you need to sit down in the shower, use a plastic, non-slip stool.  Keep the floor dry. Clean up any  water that spills on the floor as soon as it happens.  Remove soap buildup in the tub or shower regularly.  Attach bath mats securely with double-sided non-slip rug tape.  Do not have throw rugs and other things on the floor that can make you trip. What can I do in the bedroom?  Use night lights.  Make sure that you have a light by your bed that is easy to reach.  Do not use any sheets or blankets that are too big for your bed. They should not hang down onto the floor.  Have a firm chair that has side arms. You can use this for support while you get dressed.  Do not have throw rugs and other things on the floor that can make you trip. What can I do in the kitchen?  Clean up any spills right away.  Avoid walking on wet floors.  Keep items that you use a lot in easy-to-reach places.  If you need to reach something above you, use a strong step stool that has a grab bar.  Keep electrical cords out of the way.  Do not use floor polish or  wax that makes floors slippery. If you must use wax, use non-skid floor wax.  Do not have throw rugs and other things on the floor that can make you trip. What can I do with my stairs?  Do not leave any items on the stairs.  Make sure that there are handrails on both sides of the stairs and use them. Fix handrails that are broken or loose. Make sure that handrails are as long as the stairways.  Check any carpeting to make sure that it is firmly attached to the stairs. Fix any carpet that is loose or worn.  Avoid having throw rugs at the top or bottom of the stairs. If you do have throw rugs, attach them to the floor with carpet tape.  Make sure that you have a light switch at the top of the stairs and the bottom of the stairs. If you do not have them, ask someone to add them for you. What else can I do to help prevent falls?  Wear shoes that:  Do not have high heels.  Have rubber bottoms.  Are comfortable and fit you well.  Are closed  at the toe. Do not wear sandals.  If you use a stepladder:  Make sure that it is fully opened. Do not climb a closed stepladder.  Make sure that both sides of the stepladder are locked into place.  Ask someone to hold it for you, if possible.  Clearly mark and make sure that you can see:  Any grab bars or handrails.  First and last steps.  Where the edge of each step is.  Use tools that help you move around (mobility aids) if they are needed. These include:  Canes.  Walkers.  Scooters.  Crutches.  Turn on the lights when you go into a dark area. Replace any light bulbs as soon as they burn out.  Set up your furniture so you have a clear path. Avoid moving your furniture around.  If any of your floors are uneven, fix them.  If there are any pets around you, be aware of where they are.  Review your medicines with your doctor. Some medicines can make you feel dizzy. This can increase your chance of falling. Ask your doctor what other things that you can do to help prevent falls. This information is not intended to replace advice given to you by your health care provider. Make sure you discuss any questions you have with your health care provider. Document Released: 09/18/2009 Document Revised: 04/29/2016 Document Reviewed: 12/27/2014 Elsevier Interactive Patient Education  2017 [employer].

## 2021-01-26 DIAGNOSIS — D224 Melanocytic nevi of scalp and neck: Secondary | ICD-10-CM | POA: Diagnosis not present

## 2021-01-26 DIAGNOSIS — D2372 Other benign neoplasm of skin of left lower limb, including hip: Secondary | ICD-10-CM | POA: Diagnosis not present

## 2021-01-26 DIAGNOSIS — C44319 Basal cell carcinoma of skin of other parts of face: Secondary | ICD-10-CM | POA: Diagnosis not present

## 2021-01-26 DIAGNOSIS — L918 Other hypertrophic disorders of the skin: Secondary | ICD-10-CM | POA: Diagnosis not present

## 2021-01-26 DIAGNOSIS — L821 Other seborrheic keratosis: Secondary | ICD-10-CM | POA: Diagnosis not present

## 2021-01-26 DIAGNOSIS — D1801 Hemangioma of skin and subcutaneous tissue: Secondary | ICD-10-CM | POA: Diagnosis not present

## 2021-01-26 DIAGNOSIS — L218 Other seborrheic dermatitis: Secondary | ICD-10-CM | POA: Diagnosis not present

## 2021-01-26 DIAGNOSIS — D225 Melanocytic nevi of trunk: Secondary | ICD-10-CM | POA: Diagnosis not present

## 2021-02-09 DIAGNOSIS — C44319 Basal cell carcinoma of skin of other parts of face: Secondary | ICD-10-CM | POA: Diagnosis not present

## 2021-03-19 DIAGNOSIS — H26491 Other secondary cataract, right eye: Secondary | ICD-10-CM | POA: Diagnosis not present

## 2021-07-09 ENCOUNTER — Other Ambulatory Visit: Payer: Self-pay | Admitting: Adult Health

## 2021-08-05 ENCOUNTER — Encounter: Payer: Medicare HMO | Admitting: Adult Health

## 2021-08-19 ENCOUNTER — Other Ambulatory Visit: Payer: Self-pay | Admitting: Adult Health

## 2021-08-25 ENCOUNTER — Encounter: Payer: Medicare HMO | Admitting: Adult Health

## 2021-09-01 ENCOUNTER — Other Ambulatory Visit: Payer: Self-pay

## 2021-09-02 ENCOUNTER — Encounter: Payer: Self-pay | Admitting: Adult Health

## 2021-09-02 ENCOUNTER — Ambulatory Visit (INDEPENDENT_AMBULATORY_CARE_PROVIDER_SITE_OTHER): Payer: Medicare HMO | Admitting: Adult Health

## 2021-09-02 ENCOUNTER — Other Ambulatory Visit: Payer: Self-pay

## 2021-09-02 VITALS — BP 122/86 | HR 62 | Temp 98.7°F | Ht 70.75 in | Wt 226.0 lb

## 2021-09-02 DIAGNOSIS — E039 Hypothyroidism, unspecified: Secondary | ICD-10-CM

## 2021-09-02 DIAGNOSIS — R0989 Other specified symptoms and signs involving the circulatory and respiratory systems: Secondary | ICD-10-CM

## 2021-09-02 DIAGNOSIS — Z125 Encounter for screening for malignant neoplasm of prostate: Secondary | ICD-10-CM | POA: Diagnosis not present

## 2021-09-02 DIAGNOSIS — I10 Essential (primary) hypertension: Secondary | ICD-10-CM | POA: Diagnosis not present

## 2021-09-02 DIAGNOSIS — Z0000 Encounter for general adult medical examination without abnormal findings: Secondary | ICD-10-CM

## 2021-09-02 DIAGNOSIS — Z23 Encounter for immunization: Secondary | ICD-10-CM

## 2021-09-02 LAB — CBC WITH DIFFERENTIAL/PLATELET
Basophils Absolute: 0.1 10*3/uL (ref 0.0–0.1)
Basophils Relative: 1.3 % (ref 0.0–3.0)
Eosinophils Absolute: 0.3 10*3/uL (ref 0.0–0.7)
Eosinophils Relative: 6.3 % — ABNORMAL HIGH (ref 0.0–5.0)
HCT: 46.7 % (ref 39.0–52.0)
Hemoglobin: 16 g/dL (ref 13.0–17.0)
Lymphocytes Relative: 28.9 % (ref 12.0–46.0)
Lymphs Abs: 1.5 10*3/uL (ref 0.7–4.0)
MCHC: 34.4 g/dL (ref 30.0–36.0)
MCV: 95 fl (ref 78.0–100.0)
Monocytes Absolute: 0.6 10*3/uL (ref 0.1–1.0)
Monocytes Relative: 11.3 % (ref 3.0–12.0)
Neutro Abs: 2.7 10*3/uL (ref 1.4–7.7)
Neutrophils Relative %: 52.2 % (ref 43.0–77.0)
Platelets: 162 10*3/uL (ref 150.0–400.0)
RBC: 4.91 Mil/uL (ref 4.22–5.81)
RDW: 12.7 % (ref 11.5–15.5)
WBC: 5.2 10*3/uL (ref 4.0–10.5)

## 2021-09-02 LAB — LIPID PANEL
Cholesterol: 181 mg/dL (ref 0–200)
HDL: 58.5 mg/dL (ref >39.00)
LDL Cholesterol: 102 mg/dL — ABNORMAL HIGH (ref 0–99)
NonHDL: 122.73
Total CHOL/HDL Ratio: 3
Triglycerides: 106 mg/dL (ref 0.0–149.0)
VLDL: 21.2 mg/dL (ref 0.0–40.0)

## 2021-09-02 LAB — COMPREHENSIVE METABOLIC PANEL
ALT: 33 U/L (ref 0–53)
AST: 30 U/L (ref 0–37)
Albumin: 4.3 g/dL (ref 3.5–5.2)
Alkaline Phosphatase: 61 U/L (ref 39–117)
BUN: 16 mg/dL (ref 6–23)
CO2: 32 mEq/L (ref 19–32)
Calcium: 9.7 mg/dL (ref 8.4–10.5)
Chloride: 100 mEq/L (ref 96–112)
Creatinine, Ser: 0.98 mg/dL (ref 0.40–1.50)
GFR: 76.64 mL/min (ref >60.00)
Glucose, Bld: 97 mg/dL (ref 70–99)
Potassium: 4.3 mEq/L (ref 3.5–5.1)
Sodium: 139 mEq/L (ref 135–145)
Total Bilirubin: 1.2 mg/dL (ref 0.2–1.2)
Total Protein: 7.1 g/dL (ref 6.0–8.3)

## 2021-09-02 LAB — PSA: PSA: 2.18 ng/mL (ref 0.10–4.00)

## 2021-09-02 LAB — TSH: TSH: 6.54 u[IU]/mL — ABNORMAL HIGH (ref 0.35–5.50)

## 2021-09-02 NOTE — Progress Notes (Signed)
Subjective:    Patient ID: Edward Miles, male    DOB: January 03, 1946, 75 y.o.   MRN: [id number]  HPI Patient presents for yearly preventative medicine examination. He is a pleasant 75 year old male who  has a past medical history of Colon polyps, Diverticulosis, ED (erectile dysfunction), Hypertension, Hypothyroidism, and Obese.  Essential hypertension-currently prescribed atenolol 25 mg daily and chlorthalidone 12.5 mg daily.  He denies dizziness, lightheadedness, chest pain, shortness of breath, or syncopal episodes  BP Readings from Last 3 Encounters:  09/02/21 122/86  07/09/20 120/80  06/20/19 118/84   Hypothyroidism-has been well controlled in the past with Synthroid 50 mcg daily.  Lab Results  Component Value Date   TSH 5.46 (H) 08/13/2020   Carotid Artery Stenosis -1 to 39% blockage in left carotid artery, positive bruit.  Currently on Stelara 10 mg  ED- takes viagra PRN   All immunizations and health maintenance protocols were reviewed with the patient and needed orders were placed.  Appropriate screening laboratory values were ordered for the patient including screening of hyperlipidemia, renal function and hepatic function. If indicated by BPH, a PSA was ordered.  Medication reconciliation,  past medical history, social history, problem list and allergies were reviewed in detail with the patient  Goals were established with regard to weight loss, exercise, and  diet in compliance with medications. Continues to go hikig/camping on a regular basis and plays golf a few times a week. He tries to eat healthy  Wt Readings from Last 3 Encounters:  09/02/21 226 lb (102.5 kg)  07/09/20 229 lb (103.9 kg)  06/20/19 228 lb (103.4 kg)   He is up-to-date on routine colon cancer screening  Review of Systems  Constitutional: Negative.   HENT: Negative.    Eyes: Negative.   Respiratory: Negative.    Cardiovascular: Negative.   Gastrointestinal: Negative.   Endocrine:  Negative.   Genitourinary: Negative.   Musculoskeletal:  Positive for back pain (occassional low back pain).  Skin: Negative.   Allergic/Immunologic: Negative.   Neurological: Negative.   Hematological: Negative.   Psychiatric/Behavioral: Negative.    All other systems reviewed and are negative.  Past Medical History:  Diagnosis Date   Colon polyps    Diverticulosis    ED (erectile dysfunction)    Hypertension    Hypothyroidism    Obese     Social History   Socioeconomic History   Marital status: Married    Spouse name: Not on file   Number of children: Not on file   Years of education: Not on file   Highest education level: Not on file  Occupational History   Not on file  Tobacco Use   Smoking status: Never   Smokeless tobacco: Never  Substance and Sexual Activity   Alcohol use: No   Drug use: No   Sexual activity: Not on file  Other Topics Concern   Not on file  Social History Narrative   Geophysical data processor    Married    Social Determinants of Health   Financial Resource Strain: Low Risk    Difficulty of Paying Living Expenses: Not hard at all  Food Insecurity: No Food Insecurity   Worried About Charity fundraiser in the Last Year: Never true   [place] in the Last Year: Never true  Transportation Needs: No Transportation Needs   Lack of Transportation (Medical): No   Lack of Transportation (Non-Medical): No  Physical Activity: Inactive   Days of Exercise  per Week: 0 days   Minutes of Exercise per Session: 0 min  Stress: No Stress Concern Present   Feeling of Stress : Not at all  Social Connections: Moderately Integrated   Frequency of Communication with Friends and Family: Three times a week   Frequency of Social Gatherings with Friends and Family: Once a week   Attends Religious Services: Never   Marine scientist or Organizations: Yes   Attends Music therapist: 1 to 4 times per year   Marital Status: Married   Human resources officer Violence: Not At Risk   Fear of Current or Ex-Partner: No   Emotionally Abused: No   Physically Abused: No   Sexually Abused: No    Past Surgical History:  Procedure Laterality Date   CATARACT EXTRACTION, BILATERAL Bilateral    CHOLECYSTECTOMY      Family History  Problem Relation Age of Onset   Heart failure Mother    Hypertension Mother    Hypothyroidism Mother    Hodgkin's lymphoma Sister    Hypothyroidism Maternal Grandmother    Colon cancer Neg Hx    Stomach cancer Neg Hx     No Known Allergies  Current Outpatient Medications on File Prior to Visit  Medication Sig Dispense Refill   aspirin 81 MG tablet Take 81 mg by mouth daily.     atenolol (TENORMIN) 25 MG tablet TAKE 1 TABLET BY MOUTH EVERY DAY 90 tablet 0   chlorthalidone (HYGROTON) 25 MG tablet TAKE 1/2 TABLET BY MOUTH EVERY DAY 45 tablet 0   levothyroxine (SYNTHROID) 50 MCG tablet TAKE 1 TABLET BY MOUTH EVERY DAY 90 tablet 3   rosuvastatin (CRESTOR) 10 MG tablet Take 1 tablet (10 mg total) by mouth daily. (Patient not taking: Reported on 01/16/2021) 90 tablet 3   sildenafil (REVATIO) 20 MG tablet Take 1 to 2 tabs 2 - 3 hours before sex (Patient not taking: No sig reported) 30 tablet 11   No current facility-administered medications on file prior to visit.    There were no vitals taken for this visit.       Objective:   Physical Exam Vitals and nursing note reviewed.  Constitutional:      General: He is not in acute distress.    Appearance: Normal appearance. He is well-developed and normal weight.  HENT:     Head: Normocephalic and atraumatic.     Right Ear: Tympanic membrane, ear canal and external ear normal. There is no impacted cerumen.     Left Ear: Tympanic membrane, ear canal and external ear normal. There is no impacted cerumen.     Nose: Nose normal. No congestion or rhinorrhea.     Mouth/Throat:     Mouth: Mucous membranes are moist.     Pharynx: Oropharynx is clear. No  oropharyngeal exudate or posterior oropharyngeal erythema.  Eyes:     General:        Right eye: No discharge.        Left eye: No discharge.     Extraocular Movements: Extraocular movements intact.     Conjunctiva/sclera: Conjunctivae normal.     Pupils: Pupils are equal, round, and reactive to light.  Neck:     Vascular: No carotid bruit.     Trachea: No tracheal deviation.  Cardiovascular:     Rate and Rhythm: Normal rate and regular rhythm.     Pulses: Normal pulses.     Heart sounds: Normal heart sounds. No murmur heard.   No  friction rub. No gallop.  Pulmonary:     Effort: Pulmonary effort is normal. No respiratory distress.     Breath sounds: Normal breath sounds. No stridor. No wheezing, rhonchi or rales.  Chest:     Chest wall: No tenderness.  Abdominal:     General: Bowel sounds are normal. There is no distension.     Palpations: Abdomen is soft. There is no mass.     Tenderness: There is no abdominal tenderness. There is no right CVA tenderness, left CVA tenderness, guarding or rebound.     Hernia: No hernia is present.  Musculoskeletal:        General: No swelling, tenderness, deformity or signs of injury. Normal range of motion.     Right lower leg: No edema.     Left lower leg: No edema.  Lymphadenopathy:     Cervical: No cervical adenopathy.  Skin:    General: Skin is warm and dry.     Capillary Refill: Capillary refill takes less than 2 seconds.     Coloration: Skin is not jaundiced or pale.     Findings: No bruising, erythema, lesion or rash.  Neurological:     General: No focal deficit present.     Mental Status: He is alert and oriented to person, place, and time.     Cranial Nerves: No cranial nerve deficit.     Sensory: No sensory deficit.     Motor: No weakness.     Coordination: Coordination normal.     Gait: Gait normal.     Deep Tendon Reflexes: Reflexes normal.  Psychiatric:        Mood and Affect: Mood normal.        Behavior: Behavior  normal.        Thought Content: Thought content normal.        Judgment: Judgment normal.      Assessment & Plan:   1. Routine general medical examination at a health care facility - Follow up in one year or sooner if needed - Continue to stay active and eat healthy  - CBC with Differential/Platelet; Future - Comprehensive metabolic panel; Future - Lipid panel; Future - TSH; Future  2. Hypothyroidism, unspecified type - Consider change in synthroid  - CBC with Differential/Platelet; Future - Comprehensive metabolic panel; Future - Lipid panel; Future - TSH; Future  3. Essential hypertension - Well controlled.  - Ok with him trying to stop 1/2 tab chlorthalidone. He will monitor BP at home  - CBC with Differential/Platelet; Future - Comprehensive metabolic panel; Future - Lipid panel; Future - TSH; Future  4. Left carotid bruit - asymptomatic and undetectable today  - Continue with low dose statin.  - Repeat US next year  - CBC with Differential/Platelet; Future - Comprehensive metabolic panel; Future - Lipid panel; Future - TSH; Future  5. Prostate cancer screening  - PSA; Future  Dorothyann Peng, NP

## 2021-09-02 NOTE — Patient Instructions (Signed)
It was great seeing you today   We will follow up with you regarding your blood work   Continue to work on weight loss through diet and exercise

## 2021-09-03 ENCOUNTER — Other Ambulatory Visit: Payer: Self-pay | Admitting: Adult Health

## 2021-09-03 MED ORDER — LEVOTHYROXINE SODIUM 75 MCG PO TABS: 75.0000 ug | ORAL_TABLET | Freq: Every day | ORAL | 0 refills | Status: DC

## 2021-09-28 ENCOUNTER — Other Ambulatory Visit: Payer: Self-pay | Admitting: Adult Health

## 2021-10-02 ENCOUNTER — Other Ambulatory Visit: Payer: Self-pay

## 2021-10-02 ENCOUNTER — Other Ambulatory Visit: Payer: Self-pay | Admitting: Adult Health

## 2021-10-02 ENCOUNTER — Other Ambulatory Visit (INDEPENDENT_AMBULATORY_CARE_PROVIDER_SITE_OTHER): Payer: Medicare HMO

## 2021-10-02 DIAGNOSIS — E039 Hypothyroidism, unspecified: Secondary | ICD-10-CM | POA: Diagnosis not present

## 2021-10-02 LAB — TSH: TSH: 4.48 u[IU]/mL (ref 0.35–5.50)

## 2021-10-02 MED ORDER — LEVOTHYROXINE SODIUM 75 MCG PO TABS: 75.0000 ug | ORAL_TABLET | Freq: Every day | ORAL | 3 refills | Status: DC

## 2021-10-03 ENCOUNTER — Other Ambulatory Visit: Payer: Self-pay | Admitting: Adult Health

## 2021-10-11 ENCOUNTER — Other Ambulatory Visit: Payer: Self-pay | Admitting: Adult Health

## 2021-10-26 ENCOUNTER — Other Ambulatory Visit: Payer: Self-pay | Admitting: Adult Health

## 2021-12-11 DIAGNOSIS — M5451 Vertebrogenic low back pain: Secondary | ICD-10-CM | POA: Diagnosis not present

## 2021-12-11 DIAGNOSIS — M5459 Other low back pain: Secondary | ICD-10-CM | POA: Diagnosis not present

## 2021-12-24 ENCOUNTER — Telehealth (INDEPENDENT_AMBULATORY_CARE_PROVIDER_SITE_OTHER): Payer: Medicare HMO | Admitting: Adult Health

## 2021-12-24 VITALS — Temp 99.3°F

## 2021-12-24 DIAGNOSIS — U071 COVID-19: Secondary | ICD-10-CM

## 2021-12-24 MED ORDER — NIRMATRELVIR/RITONAVIR (PAXLOVID)TABLET: 3.0000 | ORAL_TABLET | Freq: Two times a day (BID) | ORAL | 0 refills | Status: AC

## 2021-12-24 NOTE — Progress Notes (Signed)
Virtual Visit via Video Note  I connected with Edward Miles on 12/24/21 at  3:30 PM EST by a video enabled telemedicine application and verified that I am speaking with the correct person using two identifiers.  Location patient: home Location provider:work or home office Persons participating in the virtual visit: patient, provider  I discussed the limitations of evaluation and management by telemedicine and the availability of in person appointments. The patient expressed understanding and agreed to proceed.   HPI: 76 year old male who is being evaluated today for an acute issue.  He tested positive for COVID yesterday, had symptoms the day before.  He was visiting his dad at his assisted living facility last weekend and his dad ended up being positive for COVID-19.  Patient's symptoms include extreme fatigue, chills, sinus congestion.  Denies fevers, nausea, diarrhea, vomiting, shortness of breath.  He has been vaccinated against COVID-19  Is interested in antiviral therapy   ROS: See pertinent positives and negatives per HPI.  Past Medical History:  Diagnosis Date   Colon polyps    Diverticulosis    ED (erectile dysfunction)    Hypertension    Hypothyroidism    Obese     Past Surgical History:  Procedure Laterality Date   CATARACT EXTRACTION, BILATERAL Bilateral    CHOLECYSTECTOMY      Family History  Problem Relation Age of Onset   Heart failure Mother    Hypertension Mother    Hypothyroidism Mother    Hodgkin's lymphoma Sister    Hypothyroidism Maternal Grandmother    Colon cancer Neg Hx    Stomach cancer Neg Hx        Current Outpatient Medications:    aspirin 81 MG tablet, Take 81 mg by mouth daily., Disp: , Rfl:    atenolol (TENORMIN) 25 MG tablet, TAKE 1 TABLET BY MOUTH EVERY DAY, Disp: 90 tablet, Rfl: 0   chlorthalidone (HYGROTON) 25 MG tablet, TAKE 1/2 TABLET BY MOUTH EVERY DAY, Disp: 45 tablet, Rfl: 0   levothyroxine (SYNTHROID) 75 MCG tablet,  TAKE 1 TABLET BY MOUTH EVERY DAY, Disp: 90 tablet, Rfl: 2   nirmatrelvir/ritonavir EUA (PAXLOVID) 20 x 150 MG & 10 x 100MG  TABS, Take 3 tablets by mouth 2 (two) times daily for 5 days. (Take nirmatrelvir 150 mg two tablets twice daily for 5 days and ritonavir 100 mg one tablet twice daily for 5 days) Patient GFR is 76, Disp: 30 tablet, Rfl: 0   rosuvastatin (CRESTOR) 10 MG tablet, Take 1 tablet (10 mg total) by mouth daily., Disp: 90 tablet, Rfl: 3   sildenafil (REVATIO) 20 MG tablet, Take 1 to 2 tabs 2 - 3 hours before sex, Disp: 30 tablet, Rfl: 11  EXAM:  VITALS per patient if applicable:  GENERAL: alert, oriented, appears well and in no acute distress  HEENT: atraumatic, conjunttiva clear, no obvious abnormalities on inspection of external nose and ears  NECK: normal movements of the head and neck  LUNGS: on inspection no signs of respiratory distress, breathing rate appears normal, no obvious gross SOB, gasping or wheezing  CV: no obvious cyanosis  MS: moves all visible extremities without noticeable abnormality  PSYCH/NEURO: pleasant and cooperative, no obvious depression or anxiety, speech and thought processing grossly intact  ASSESSMENT AND PLAN:  Discussed the following assessment and plan:  1. COVID-19 virus infection -Discussed side effects of the medication with the patient.  We will send in  [facility] to follow-up if symptoms worsen - nirmatrelvir/ritonavir EUA (PAXLOVID) 20  x 150 MG & 10 x 100MG  TABS; Take 3 tablets by mouth 2 (two) times daily for 5 days. (Take nirmatrelvir 150 mg two tablets twice daily for 5 days and ritonavir 100 mg one tablet twice daily for 5 days) Patient GFR is 76  Dispense: 30 tablet; Refill: 0       I discussed the assessment and treatment plan with the patient. The patient was provided an opportunity to ask questions and all were answered. The patient agreed with the plan and demonstrated an understanding of the instructions.    The patient was advised to call back or seek an in-person evaluation if the symptoms worsen or if the condition fails to improve as anticipated.   Dorothyann Peng, NP

## 2022-01-08 DIAGNOSIS — M5416 Radiculopathy, lumbar region: Secondary | ICD-10-CM | POA: Diagnosis not present

## 2022-01-09 ENCOUNTER — Other Ambulatory Visit: Payer: Self-pay | Admitting: Adult Health

## 2022-01-15 DIAGNOSIS — M5416 Radiculopathy, lumbar region: Secondary | ICD-10-CM | POA: Diagnosis not present

## 2022-01-19 ENCOUNTER — Telehealth: Payer: Self-pay | Admitting: Adult Health

## 2022-01-19 NOTE — Telephone Encounter (Signed)
Left message for patient to call back and schedule Medicare Annual Wellness Visit (AWV) either virtually or in office. Left  my jabber number [phone] ° ° °Last AWV ;01/16/21 ° please schedule at anytime with [facility] Nurse Health Advisor 1 or 2 ° ° °This should be a 45 minute visit.  °

## 2022-01-22 DIAGNOSIS — M5416 Radiculopathy, lumbar region: Secondary | ICD-10-CM | POA: Diagnosis not present

## 2022-01-25 ENCOUNTER — Ambulatory Visit (INDEPENDENT_AMBULATORY_CARE_PROVIDER_SITE_OTHER): Payer: Medicare HMO

## 2022-01-25 VITALS — Ht 71.0 in | Wt 220.0 lb

## 2022-01-25 DIAGNOSIS — Z0000 Encounter for general adult medical examination without abnormal findings: Secondary | ICD-10-CM

## 2022-01-25 NOTE — Patient Instructions (Signed)
[name] , Thank you for taking time to come for your Medicare Wellness Visit. I appreciate your ongoing commitment to your health goals. Please review the following plan we discussed and let me know if I can assist you in the future.   Screening recommendations/referrals: Colonoscopy: completed 01/11/2014, due 01/12/2024 Recommended yearly ophthalmology/optometry visit for glaucoma screening and checkup Recommended yearly dental visit for hygiene and checkup  Vaccinations: Influenza vaccine: completed 09/02/2021, due next flu season Pneumococcal vaccine: completed 03/08/2016 Tdap vaccine: completed 11/22/2013, due 11/23/2023 Shingles vaccine: completed   Covid-19:  09/04/2021, 09/19/2020, 01/16/2020, 12/27/2019  Advanced directives: Please bring a copy of your POA (Power of Attorney) and/or Living Will to your next appointment.   Conditions/risks identified: none  Next appointment: Follow up in one year for your annual wellness visit.   Preventive Care 74 Years and Older, Male Preventive care refers to lifestyle choices and visits with your health care provider that can promote health and wellness. What does preventive care include? A yearly physical exam. This is also called an annual well check. Dental exams once or twice a year. Routine eye exams. Ask your health care provider how often you should have your eyes checked. Personal lifestyle choices, including: Daily care of your teeth and gums. Regular physical activity. Eating a healthy diet. Avoiding tobacco and drug use. Limiting alcohol use. Practicing safe sex. Taking low doses of aspirin every day. Taking vitamin and mineral supplements as recommended by your health care provider. What happens during an annual well check? The services and screenings done by your health care provider during your annual well check will depend on your age, overall health, lifestyle risk factors, and family history of disease. Counseling   Your health care provider may ask you questions about your: Alcohol use. Tobacco use. Drug use. Emotional well-being. Home and relationship well-being. Sexual activity. Eating habits. History of falls. Memory and ability to understand (cognition). Work and work Statistician. Screening  You may have the following tests or measurements: Height, weight, and BMI. Blood pressure. Lipid and cholesterol levels. These may be checked every 5 years, or more frequently if you are over 66 years old. Skin check. Lung cancer screening. You may have this screening every year starting at age 29 if you have a 30-pack-year history of smoking and currently smoke or have quit within the past 15 years. Fecal occult blood test (FOBT) of the stool. You may have this test every year starting at age 26. Flexible sigmoidoscopy or colonoscopy. You may have a sigmoidoscopy every 5 years or a colonoscopy every 10 years starting at age 78. Prostate cancer screening. Recommendations will vary depending on your family history and other risks. Hepatitis C blood test. Hepatitis B blood test. Sexually transmitted disease (STD) testing. Diabetes screening. This is done by checking your blood sugar (glucose) after you have not eaten for a while (fasting). You may have this done every 1-3 years. Abdominal aortic aneurysm (AAA) screening. You may need this if you are a current or former smoker. Osteoporosis. You may be screened starting at age 40 if you are at high risk. Talk with your health care provider about your test results, treatment options, and if necessary, the need for more tests. Vaccines  Your health care provider may recommend certain vaccines, such as: Influenza vaccine. This is recommended every year. Tetanus, diphtheria, and acellular pertussis (Tdap, Td) vaccine. You may need a Td booster every 10 years. Zoster vaccine. You may need this after age 42. Pneumococcal 13-valent  conjugate (PCV13) vaccine.  One dose is recommended after age 53. Pneumococcal polysaccharide (PPSV23) vaccine. One dose is recommended after age 21. Talk to your health care provider about which screenings and vaccines you need and how often you need them. This information is not intended to replace advice given to you by your health care provider. Make sure you discuss any questions you have with your health care provider. Document Released: 12/19/2015 Document Revised: 08/11/2016 Document Reviewed: 09/23/2015 Elsevier Interactive Patient Education  2017 [place] Prevention in the Home Falls can cause injuries. They can happen to people of all ages. There are many things you can do to make your home safe and to help prevent falls. What can I do on the outside of my home? Regularly fix the edges of walkways and driveways and fix any cracks. Remove anything that might make you trip as you walk through a door, such as a raised step or threshold. Trim any bushes or trees on the path to your home. Use bright outdoor lighting. Clear any walking paths of anything that might make someone trip, such as rocks or tools. Regularly check to see if handrails are loose or broken. Make sure that both sides of any steps have handrails. Any raised decks and porches should have guardrails on the edges. Have any leaves, snow, or ice cleared regularly. Use sand or salt on walking paths during winter. Clean up any spills in your garage right away. This includes oil or grease spills. What can I do in the bathroom? Use night lights. Install grab bars by the toilet and in the tub and shower. Do not use towel bars as grab bars. Use non-skid mats or decals in the tub or shower. If you need to sit down in the shower, use a plastic, non-slip stool. Keep the floor dry. Clean up any water that spills on the floor as soon as it happens. Remove soap buildup in the tub or shower regularly. Attach bath mats securely with double-sided  non-slip rug tape. Do not have throw rugs and other things on the floor that can make you trip. What can I do in the bedroom? Use night lights. Make sure that you have a light by your bed that is easy to reach. Do not use any sheets or blankets that are too big for your bed. They should not hang down onto the floor. Have a firm chair that has side arms. You can use this for support while you get dressed. Do not have throw rugs and other things on the floor that can make you trip. What can I do in the kitchen? Clean up any spills right away. Avoid walking on wet floors. Keep items that you use a lot in easy-to-reach places. If you need to reach something above you, use a strong step stool that has a grab bar. Keep electrical cords out of the way. Do not use floor polish or wax that makes floors slippery. If you must use wax, use non-skid floor wax. Do not have throw rugs and other things on the floor that can make you trip. What can I do with my stairs? Do not leave any items on the stairs. Make sure that there are handrails on both sides of the stairs and use them. Fix handrails that are broken or loose. Make sure that handrails are as long as the stairways. Check any carpeting to make sure that it is firmly attached to the stairs. Fix any carpet that  is loose or worn. Avoid having throw rugs at the top or bottom of the stairs. If you do have throw rugs, attach them to the floor with carpet tape. Make sure that you have a light switch at the top of the stairs and the bottom of the stairs. If you do not have them, ask someone to add them for you. What else can I do to help prevent falls? Wear shoes that: Do not have high heels. Have rubber bottoms. Are comfortable and fit you well. Are closed at the toe. Do not wear sandals. If you use a stepladder: Make sure that it is fully opened. Do not climb a closed stepladder. Make sure that both sides of the stepladder are locked into place. Ask  someone to hold it for you, if possible. Clearly mark and make sure that you can see: Any grab bars or handrails. First and last steps. Where the edge of each step is. Use tools that help you move around (mobility aids) if they are needed. These include: Canes. Walkers. Scooters. Crutches. Turn on the lights when you go into a dark area. Replace any light bulbs as soon as they burn out. Set up your furniture so you have a clear path. Avoid moving your furniture around. If any of your floors are uneven, fix them. If there are any pets around you, be aware of where they are. Review your medicines with your doctor. Some medicines can make you feel dizzy. This can increase your chance of falling. Ask your doctor what other things that you can do to help prevent falls. This information is not intended to replace advice given to you by your health care provider. Make sure you discuss any questions you have with your health care provider. Document Released: 09/18/2009 Document Revised: 04/29/2016 Document Reviewed: 12/27/2014 Elsevier Interactive Patient Education  2017 [employer].

## 2022-01-25 NOTE — Progress Notes (Signed)
I connected with [name] today by telephone and verified that I am speaking with the correct person using two identifiers. Location patient: home Location provider: work Persons participating in the virtual visit: Mauri Pole LPN.   I discussed the limitations, risks, security and privacy concerns of performing an evaluation and management service by telephone and the availability of in person appointments. I also discussed with the patient that there may be a patient responsible charge related to this service. The patient expressed understanding and verbally consented to this telephonic visit.    Interactive audio and video telecommunications were attempted between this provider and patient, however failed, due to patient having technical difficulties OR patient did not have access to video capability.  We continued and completed visit with audio only.     Vital signs may be patient reported or missing.  Subjective:   Edward Miles is a 76 y.o. male who presents for Medicare Annual/Subsequent preventive examination.  Review of Systems     Cardiac Risk Factors include: advanced age (>42men, >46 women);hypertension;male gender;obesity (BMI >30kg/m2)     Objective:    Today's Vitals   01/25/22 1341  Weight: 220 lb (99.8 kg)  Height: 5\' 11"  (1.803 m)   Body mass index is 30.68 kg/m.  Advanced Directives 01/25/2022 01/16/2021  Does Patient Have a Medical Advance Directive? Yes Yes  Type of Paramedic of [place];Living will [place];Living will  Does patient want to make changes to medical advance directive? - No - Patient declined  Copy of [place] in Chart? No - copy requested No - copy requested    Current Medications (verified) Outpatient Encounter Medications as of 01/25/2022  Medication Sig   atenolol (TENORMIN) 25 MG tablet TAKE 1 TABLET BY MOUTH EVERY DAY   levothyroxine  (SYNTHROID) 75 MCG tablet TAKE 1 TABLET BY MOUTH EVERY DAY   aspirin 81 MG tablet Take 81 mg by mouth daily. (Patient not taking: Reported on 01/25/2022)   chlorthalidone (HYGROTON) 25 MG tablet TAKE 1/2 TABLET BY MOUTH EVERY DAY (Patient not taking: Reported on 01/25/2022)   rosuvastatin (CRESTOR) 10 MG tablet Take 1 tablet (10 mg total) by mouth daily. (Patient not taking: Reported on 01/25/2022)   sildenafil (REVATIO) 20 MG tablet Take 1 to 2 tabs 2 - 3 hours before sex   No facility-administered encounter medications on file as of 01/25/2022.    Allergies (verified) Patient has no known allergies.   History: Past Medical History:  Diagnosis Date   Colon polyps    Diverticulosis    ED (erectile dysfunction)    Hypertension    Hypothyroidism    Obese    Past Surgical History:  Procedure Laterality Date   CATARACT EXTRACTION, BILATERAL Bilateral    CHOLECYSTECTOMY     Family History  Problem Relation Age of Onset   Heart failure Mother    Hypertension Mother    Hypothyroidism Mother    Hodgkin's lymphoma Sister    Hypothyroidism Maternal Grandmother    Colon cancer Neg Hx    Stomach cancer Neg Hx    Social History   Socioeconomic History   Marital status: Married    Spouse name: Not on file   Number of children: Not on file   Years of education: Not on file   Highest education level: Not on file  Occupational History   Not on file  Tobacco Use   Smoking status: Never    Passive  exposure: Never   Smokeless tobacco: Never  Vaping Use   Vaping Use: Never used  Substance and Sexual Activity   Alcohol use: No   Drug use: No   Sexual activity: Not on file  Other Topics Concern   Not on file  Social History Narrative   Geophysical data processor    Married    Social Determinants of Health   Financial Resource Strain: Low Risk    Difficulty of Paying Living Expenses: Not hard at all  Food Insecurity: No Food Insecurity   Worried About Charity fundraiser in the  Last Year: Never true   [place] in the Last Year: Never true  Transportation Needs: No Transportation Needs   Lack of Transportation (Medical): No   Lack of Transportation (Non-Medical): No  Physical Activity: Sufficiently Active   Days of Exercise per Week: 7 days   Minutes of Exercise per Session: 30 min  Stress: No Stress Concern Present   Feeling of Stress : Not at all  Social Connections: Not on file    Tobacco Counseling Counseling given: Not Answered   Clinical Intake:  Pre-visit preparation completed: Yes  Pain : No/denies pain     Nutritional Status: BMI > 30  Obese Nutritional Risks: None Diabetes: No  How often do you need to have someone help you when you read instructions, pamphlets, or other written materials from your doctor or pharmacy?: 1 - Never  Diabetic? no  Interpreter Needed?: No  Information entered by :: NAllen LPN   Activities of Daily Living In your present state of health, do you have any difficulty performing the following activities: 01/25/2022  Hearing? N  Vision? N  Difficulty concentrating or making decisions? N  Walking or climbing stairs? N  Dressing or bathing? N  Doing errands, shopping? N  Preparing Food and eating ? N  Using the Toilet? N  In the past six months, have you accidently leaked urine? N  Do you have problems with loss of bowel control? N  Managing your Medications? N  Managing your Finances? N  Housekeeping or managing your Housekeeping? N  Some recent data might be hidden    Patient Care Team: Dorothyann Peng, NP as PCP - General (Family Medicine) [facility] as Consulting Physician (Orthopedic Surgery)  Indicate any recent Medical Services you may have received from other than [facility] providers in the past year (date may be approximate).     Assessment:   This is a routine wellness examination for Edward Miles.  Hearing/Vision screen Vision Screening - Comments:: Regular eye exams, [place]  Specialist  Dietary issues and exercise activities discussed: Current Exercise Habits: Home exercise routine, Type of exercise: strength training/weights, Time (Minutes): 30, Frequency (Times/Week): 7, Weekly Exercise (Minutes/Week): 210   Goals Addressed             This Visit's Progress    Patient Stated       01/25/2022, keep living       Depression Screen PHQ 2/9 Scores 01/25/2022 01/16/2021 07/09/2020 09/07/2018 03/14/2017 03/08/2016 12/12/2014  PHQ - 2 Score 0 0 0 0 0 0 0    Fall Risk Fall Risk  01/25/2022 01/16/2021 07/09/2020 09/07/2018 03/14/2017  Falls in the past year? 1 0 1 No No  Comment tripped hiking - - - -  Number falls in past yr: 1 0 0 - -  Injury with Fall? 0 0 - - -  Risk for fall due to : Medication side effect No  Fall Risks - - -  Follow up Falls evaluation completed;Education provided;Falls prevention discussed Falls evaluation completed;Falls prevention discussed - - -    FALL RISK PREVENTION PERTAINING TO THE HOME:  Any stairs in or around the home? Yes  If so, are there any without handrails? No  Home free of loose throw rugs in walkways, pet beds, electrical cords, etc? Yes  Adequate lighting in your home to reduce risk of falls? Yes   ASSISTIVE DEVICES UTILIZED TO PREVENT FALLS:  Life alert? No  Use of a cane, walker or w/c? No  Grab bars in the bathroom? Yes  Shower chair or bench in shower? Yes  Elevated toilet seat or a handicapped toilet? Yes   TIMED UP AND GO:  Was the test performed? No .      Cognitive Function:        Immunizations Immunization History  Administered Date(s) Administered   Fluad Quad(high Dose 65+) 09/02/2021   Influenza Split 09/14/2011   Influenza Whole 08/03/2010   Influenza, High Dose Seasonal PF 12/12/2014, 09/07/2018   Influenza, Seasonal, Injecte, Preservative Fre 11/21/2012   Influenza,inj,Quad PF,6+ Mos 11/22/2013   PFIZER(Purple Top)SARS-COV-2 Vaccination 12/27/2019, 01/16/2020, 09/19/2020   Pfizer  Covid-19 Vaccine Bivalent Booster 43yrs & up 09/04/2021   Pneumococcal Conjugate-13 11/22/2013   Pneumococcal Polysaccharide-23 03/08/2016   Td 03/21/2006   Tdap 11/22/2013   Zoster Recombinat (Shingrix) 10/18/2021, 01/15/2022   Zoster, Live 09/14/2011    TDAP status: Up to date  Flu Vaccine status: Up to date  Pneumococcal vaccine status: Up to date  Covid-19 vaccine status: Completed vaccines  Qualifies for Shingles Vaccine? Yes   Zostavax completed Yes   Shingrix Completed?: Yes  Screening Tests Health Maintenance  Topic Date Due   TETANUS/TDAP  11/23/2023   COLONOSCOPY (Pts 45-66yrs Insurance coverage will need to be confirmed)  01/12/2024   Pneumonia Vaccine 93+ Years old  Completed   INFLUENZA VACCINE  Completed   COVID-19 Vaccine  Completed   Hepatitis C Screening  Completed   Zoster Vaccines- Shingrix  Completed   HPV VACCINES  Aged Out    Health Maintenance  There are no preventive care reminders to display for this patient.  Colorectal cancer screening: Type of screening: Colonoscopy. Completed 01/11/2014. Repeat every 10 years  Lung Cancer Screening: (Low Dose CT Chest recommended if Age 24-80 years, 30 pack-year currently smoking OR have quit w/in 15years.) does not qualify.   Lung Cancer Screening Referral: no  Additional Screening:  Hepatitis C Screening: does qualify; Completed 07/09/2020  Vision Screening: Recommended annual ophthalmology exams for early detection of glaucoma and other disorders of the eye. Is the patient up to date with their annual eye exam?  Yes  Who is the provider or what is the name of the office in which the patient attends annual eye exams? [facility] If pt is not established with a provider, would they like to be referred to a provider to establish care? No .   Dental Screening: Recommended annual dental exams for proper oral hygiene  Community Resource Referral / Chronic Care Management: CRR required this  visit?  No   CCM required this visit?  No      Plan:     I have personally reviewed and noted the following in the patients chart:   Medical and social history Use of alcohol, tobacco or illicit drugs  Current medications and supplements including opioid prescriptions. Patient is not currently taking opioid prescriptions. Functional ability and status  Nutritional status Physical activity Advanced directives List of other physicians Hospitalizations, surgeries, and ER visits in previous 12 months Vitals Screenings to include cognitive, depression, and falls Referrals and appointments  In addition, I have reviewed and discussed with patient certain preventive protocols, quality metrics, and best practice recommendations. A written personalized care plan for preventive services as well as general preventive health recommendations were provided to patient.     Kellie Simmering, LPN   [id number]   Nurse Notes: 6 CIT not administered. Patient has normal cognition per conversation.  Due to this being a virtual visit, the after visit summary with patients personalized plan was offered to patient via mail or my-chart.  patient was mailed a copy of AVS.

## 2022-01-29 DIAGNOSIS — M5416 Radiculopathy, lumbar region: Secondary | ICD-10-CM | POA: Diagnosis not present

## 2022-02-05 DIAGNOSIS — M5416 Radiculopathy, lumbar region: Secondary | ICD-10-CM | POA: Diagnosis not present

## 2022-02-12 DIAGNOSIS — M5416 Radiculopathy, lumbar region: Secondary | ICD-10-CM | POA: Diagnosis not present

## 2022-02-19 DIAGNOSIS — M5416 Radiculopathy, lumbar region: Secondary | ICD-10-CM | POA: Diagnosis not present

## 2022-03-28 ENCOUNTER — Other Ambulatory Visit: Payer: Self-pay | Admitting: Adult Health

## 2022-03-30 NOTE — Telephone Encounter (Signed)
Last OV 09/02/21: ?3. Essential hypertension ?- Well controlled.  ?- Ok with him trying to stop 1/2 tab chlorthalidone. He will monitor BP at home  ? ?Called pt to verify if still taking. Pt states he is no longer taking medication.  ? ? ?

## 2022-04-09 DIAGNOSIS — Z0101 Encounter for examination of eyes and vision with abnormal findings: Secondary | ICD-10-CM | POA: Diagnosis not present

## 2022-04-11 ENCOUNTER — Other Ambulatory Visit: Payer: Self-pay | Admitting: Adult Health

## 2022-07-09 ENCOUNTER — Other Ambulatory Visit: Payer: Self-pay | Admitting: Adult Health

## 2022-07-15 ENCOUNTER — Ambulatory Visit (INDEPENDENT_AMBULATORY_CARE_PROVIDER_SITE_OTHER): Payer: Medicare HMO | Admitting: Adult Health

## 2022-07-15 ENCOUNTER — Encounter: Payer: Self-pay | Admitting: Adult Health

## 2022-07-15 VITALS — BP 126/80 | HR 55 | Temp 98.0°F | Ht 71.0 in | Wt 228.0 lb

## 2022-07-15 DIAGNOSIS — J0100 Acute maxillary sinusitis, unspecified: Secondary | ICD-10-CM

## 2022-07-15 MED ORDER — DOXYCYCLINE HYCLATE 100 MG PO CAPS: 100.0000 mg | ORAL_CAPSULE | Freq: Two times a day (BID) | ORAL | 0 refills | Status: DC

## 2022-07-15 NOTE — Progress Notes (Signed)
Subjective:    Patient ID: Edward Miles, male    DOB: 19-Jun-1946, 76 y.o.   MRN: [id number]  HPI  76 year old male who  has a past medical history of Colon polyps, Diverticulosis, ED (erectile dysfunction), Hypertension, Hypothyroidism, and Obese.  He presents to the office today for an acute issue. He reports about a week of sinus congestion, facial pain, itchy throat, sinus headache, and fatigue. He has been using benadryl at home and reports relief for about an hour. He does have seasonal allergies and reports this feels different   He denies fevers, chills, shortness of breath, or cough.   Review of Systems See HPI   Past Medical History:  Diagnosis Date   Colon polyps    Diverticulosis    ED (erectile dysfunction)    Hypertension    Hypothyroidism    Obese     Social History   Socioeconomic History   Marital status: Married    Spouse name: Not on file   Number of children: Not on file   Years of education: Not on file   Highest education level: Not on file  Occupational History   Not on file  Tobacco Use   Smoking status: Never    Passive exposure: Never   Smokeless tobacco: Never  Vaping Use   Vaping Use: Never used  Substance and Sexual Activity   Alcohol use: No   Drug use: No   Sexual activity: Not on file  Other Topics Concern   Not on file  Social History Narrative   Geophysical data processor    Married    Social Determinants of Health   Financial Resource Strain: Low Risk  (01/25/2022)   Overall Financial Resource Strain (CARDIA)    Difficulty of Paying Living Expenses: Not hard at all  Food Insecurity: No Food Insecurity (01/25/2022)   Hunger Vital Sign    Worried About Running Out of Food in the Last Year: Never true    [place] in the Last Year: Never true  Transportation Needs: No Transportation Needs (01/25/2022)   PRAPARE - Hydrologist (Medical): No    Lack of Transportation (Non-Medical): No   Physical Activity: Sufficiently Active (01/25/2022)   Exercise Vital Sign    Days of Exercise per Week: 7 days    Minutes of Exercise per Session: 30 min  Stress: No Stress Concern Present (01/25/2022)   [place]    Feeling of Stress : Not at all  Social Connections: Moderately Integrated (01/16/2021)   Social Connection and Isolation Panel [NHANES]    Frequency of Communication with Friends and Family: Three times a week    Frequency of Social Gatherings with Friends and Family: Once a week    Attends Religious Services: Never    Marine scientist or Organizations: Yes    Attends Archivist Meetings: 1 to 4 times per year    Marital Status: Married  Human resources officer Violence: Not At Risk (01/16/2021)   Humiliation, Afraid, Rape, and Kick questionnaire    Fear of Current or Ex-Partner: No    Emotionally Abused: No    Physically Abused: No    Sexually Abused: No    Past Surgical History:  Procedure Laterality Date   CATARACT EXTRACTION, BILATERAL Bilateral    CHOLECYSTECTOMY      Family History  Problem Relation Age of Onset   Heart failure Mother  Hypertension Mother    Hypothyroidism Mother    Hodgkin's lymphoma Sister    Hypothyroidism Maternal Grandmother    Colon cancer Neg Hx    Stomach cancer Neg Hx     No Known Allergies  Current Outpatient Medications on File Prior to Visit  Medication Sig Dispense Refill   aspirin 81 MG tablet Take 81 mg by mouth daily.     atenolol (TENORMIN) 25 MG tablet TAKE 1 TABLET BY MOUTH EVERY DAY 90 tablet 0   chlorthalidone (HYGROTON) 25 MG tablet TAKE 1/2 TABLET BY MOUTH EVERY DAY 45 tablet 0   levothyroxine (SYNTHROID) 75 MCG tablet TAKE 1 TABLET BY MOUTH EVERY DAY 90 tablet 2   rosuvastatin (CRESTOR) 10 MG tablet Take 1 tablet (10 mg total) by mouth daily. 90 tablet 3   sildenafil (REVATIO) 20 MG tablet Take 1 to 2 tabs 2 - 3 hours before sex 30  tablet 11   No current facility-administered medications on file prior to visit.    BP 126/80   Pulse (!) 55   Temp 98 F (36.7 C) (Oral)   Ht '5\' 11"'$  (1.803 m)   Wt 228 lb (103.4 kg)   SpO2 97%   BMI 31.80 kg/m       Objective:   Physical Exam Vitals and nursing note reviewed.  Constitutional:      Appearance: Normal appearance.  HENT:     Right Ear: A middle ear effusion is present. Tympanic membrane is not erythematous.     Left Ear: A middle ear effusion is present. Tympanic membrane is not erythematous.     Nose: Congestion present. No rhinorrhea.     Right Turbinates: Enlarged and swollen.     Left Turbinates: Not enlarged or swollen.     Right Sinus: Maxillary sinus tenderness present.     Mouth/Throat:     Mouth: Mucous membranes are moist.  Cardiovascular:     Rate and Rhythm: Normal rate and regular rhythm.     Pulses: Normal pulses.     Heart sounds: Normal heart sounds.  Pulmonary:     Effort: Pulmonary effort is normal.     Breath sounds: Normal breath sounds.  Skin:    General: Skin is warm and dry.     Capillary Refill: Capillary refill takes less than 2 seconds.  Neurological:     Mental Status: He is alert.  Psychiatric:        Mood and Affect: Mood normal.        Behavior: Behavior normal.        Thought Content: Thought content normal.        Judgment: Judgment normal.        Assessment & Plan:   1. Acute non-recurrent maxillary sinusitis - doxycycline (VIBRAMYCIN) 100 MG capsule; Take 1 capsule (100 mg total) by mouth 2 (two) times daily.  Dispense: 14 capsule; Refill: 0 - Follow up if no improvement in the next 3-4 days   Dorothyann Peng, NP

## 2022-07-26 ENCOUNTER — Telehealth: Payer: Self-pay | Admitting: Adult Health

## 2022-07-26 ENCOUNTER — Encounter: Payer: Self-pay | Admitting: Adult Health

## 2022-07-26 NOTE — Telephone Encounter (Signed)
Pt is calling and would a referral to ENT for sinus issues. Pt is aware cory not in office today 07-26-2022

## 2022-07-27 ENCOUNTER — Other Ambulatory Visit: Payer: Self-pay | Admitting: Adult Health

## 2022-07-27 DIAGNOSIS — J329 Chronic sinusitis, unspecified: Secondary | ICD-10-CM

## 2022-09-24 ENCOUNTER — Ambulatory Visit (INDEPENDENT_AMBULATORY_CARE_PROVIDER_SITE_OTHER): Payer: Medicare HMO | Admitting: Adult Health

## 2022-09-24 ENCOUNTER — Encounter: Payer: Self-pay | Admitting: Adult Health

## 2022-09-24 VITALS — BP 112/78 | HR 67 | Temp 97.7°F | Ht 70.5 in | Wt 226.0 lb

## 2022-09-24 DIAGNOSIS — J0141 Acute recurrent pansinusitis: Secondary | ICD-10-CM

## 2022-09-24 DIAGNOSIS — E782 Mixed hyperlipidemia: Secondary | ICD-10-CM | POA: Diagnosis not present

## 2022-09-24 DIAGNOSIS — I10 Essential (primary) hypertension: Secondary | ICD-10-CM | POA: Diagnosis not present

## 2022-09-24 DIAGNOSIS — E039 Hypothyroidism, unspecified: Secondary | ICD-10-CM | POA: Diagnosis not present

## 2022-09-24 DIAGNOSIS — Z125 Encounter for screening for malignant neoplasm of prostate: Secondary | ICD-10-CM | POA: Diagnosis not present

## 2022-09-24 DIAGNOSIS — Z0000 Encounter for general adult medical examination without abnormal findings: Secondary | ICD-10-CM | POA: Diagnosis not present

## 2022-09-24 LAB — CBC WITH DIFFERENTIAL/PLATELET
Basophils Absolute: 0.1 10*3/uL (ref 0.0–0.1)
Basophils Relative: 1 % (ref 0.0–3.0)
Eosinophils Absolute: 0.4 10*3/uL (ref 0.0–0.7)
Eosinophils Relative: 5.3 % — ABNORMAL HIGH (ref 0.0–5.0)
HCT: 45.4 % (ref 39.0–52.0)
Hemoglobin: 15.5 g/dL (ref 13.0–17.0)
Lymphocytes Relative: 20.4 % (ref 12.0–46.0)
Lymphs Abs: 1.5 10*3/uL (ref 0.7–4.0)
MCHC: 34.2 g/dL (ref 30.0–36.0)
MCV: 94.4 fl (ref 78.0–100.0)
Monocytes Absolute: 0.8 10*3/uL (ref 0.1–1.0)
Monocytes Relative: 10.2 % (ref 3.0–12.0)
Neutro Abs: 4.7 10*3/uL (ref 1.4–7.7)
Neutrophils Relative %: 63.1 % (ref 43.0–77.0)
Platelets: 185 10*3/uL (ref 150.0–400.0)
RBC: 4.81 Mil/uL (ref 4.22–5.81)
RDW: 13.3 % (ref 11.5–15.5)
WBC: 7.4 10*3/uL (ref 4.0–10.5)

## 2022-09-24 LAB — COMPREHENSIVE METABOLIC PANEL
ALT: 24 U/L (ref 0–53)
AST: 23 U/L (ref 0–37)
Albumin: 4.1 g/dL (ref 3.5–5.2)
Alkaline Phosphatase: 93 U/L (ref 39–117)
BUN: 13 mg/dL (ref 6–23)
CO2: 27 mEq/L (ref 19–32)
Calcium: 9.3 mg/dL (ref 8.4–10.5)
Chloride: 102 mEq/L (ref 96–112)
Creatinine, Ser: 0.99 mg/dL (ref 0.40–1.50)
GFR: 75.15 mL/min (ref >60.00)
Glucose, Bld: 98 mg/dL (ref 70–99)
Potassium: 4.7 mEq/L (ref 3.5–5.1)
Sodium: 137 mEq/L (ref 135–145)
Total Bilirubin: 1.2 mg/dL (ref 0.2–1.2)
Total Protein: 7.1 g/dL (ref 6.0–8.3)

## 2022-09-24 LAB — LIPID PANEL
Cholesterol: 172 mg/dL (ref 0–200)
HDL: 54.1 mg/dL (ref >39.00)
LDL Cholesterol: 104 mg/dL — ABNORMAL HIGH (ref 0–99)
NonHDL: 117.96
Total CHOL/HDL Ratio: 3
Triglycerides: 72 mg/dL (ref 0.0–149.0)
VLDL: 14.4 mg/dL (ref 0.0–40.0)

## 2022-09-24 LAB — TSH: TSH: 4.63 u[IU]/mL (ref 0.35–5.50)

## 2022-09-24 LAB — PSA: PSA: 2.6 ng/mL (ref 0.10–4.00)

## 2022-09-24 MED ORDER — AMOXICILLIN-POT CLAVULANATE 875-125 MG PO TABS: 1.0000 | ORAL_TABLET | Freq: Two times a day (BID) | ORAL | 0 refills | Status: DC

## 2022-09-24 MED ORDER — METHYLPREDNISOLONE 4 MG PO TBPK: ORAL_TABLET | ORAL | 0 refills | Status: DC

## 2022-09-24 NOTE — Patient Instructions (Addendum)
It was great seeing you today   We will follow up with you regarding your lab work   Please let me know if you need anything   I have sent in Augmentin and prednisone to help you with your sinus infection. Let me know next week if not any better

## 2022-09-24 NOTE — Progress Notes (Signed)
Subjective:    Patient ID: Edward Miles, male    DOB: 09-Apr-1946, 76 y.o.   MRN: [id number]  HPI Patient presents for yearly preventative medicine examination. He is a pleasant 76 year old male who  has a past medical history of Colon polyps, Diverticulosis, ED (erectile dysfunction), Hypertension, Hypothyroidism, and Obese.  Essential hypertension-managed with atenolol 25 mg daily and chlorthalidone 12.5 mg daily.  He denies dizziness, lightheadedness, chest pain, or shortness of breath BP Readings from Last 3 Encounters:  09/24/22 112/78  07/15/22 126/80  09/02/21 122/86   Hypothyroidism-has been well controlled in the past with Synthroid 75 mcg daily Lab Results  Component Value Date   TSH 4.48 10/02/2021   Erectile dysfunction -uses Viagra as needed  Hyperlipidemia - managed with Crestor 20 mg daily.  Lab Results  Component Value Date   CHOL 181 09/02/2021   HDL 58.50 09/02/2021   LDLCALC 102 (H) 09/02/2021   TRIG 106.0 09/02/2021   CHOLHDL 3 09/02/2021    All immunizations and health maintenance protocols were reviewed with the patient and needed orders were placed.  Appropriate screening laboratory values were ordered for the patient including screening of hyperlipidemia, renal function and hepatic function. If indicated by BPH, a PSA was ordered.  Medication reconciliation,  past medical history, social history, problem list and allergies were reviewed in detail with the patient  Goals were established with regard to weight loss, exercise, and  diet in compliance with medications.  He continues to enjoy going hiking and camping throughout the year and plays golf a few times a week.  He does try and eat healthy Wt Readings from Last 3 Encounters:  09/24/22 226 lb (102.5 kg)  07/15/22 228 lb (103.4 kg)  01/25/22 220 lb (99.8 kg)   He is up-to-date on routine colon cancer screening  Acutely he believes that he has a recurrent sinus infection.  He was treated  for sinusitis 2 months ago with doxycycline.  He reports that it took a good 2 weeks for him to recover, he and his wife went up [place] to camp around [facility] soon after the sinus infection, she tested positive for COVID and thankfully the symptoms were minor a day after his wife tested positive he tested positive as well.  His symptoms improved but starting about a week ago he started to develop rhinorrhea, productive cough, postnasal drip, and feeling acutely ill.  He does believe that he had a fever for a day or so and has had chills.  Has not had any pain or pressure in his sinuses but does feel congested.   Review of Systems  Constitutional:  Positive for chills and fever.  HENT:  Positive for congestion, postnasal drip, rhinorrhea and sore throat. Negative for facial swelling, sinus pressure and sinus pain.   Eyes: Negative.   Respiratory: Negative.    Cardiovascular: Negative.   Gastrointestinal: Negative.   Endocrine: Negative.   Genitourinary: Negative.   Musculoskeletal: Negative.   Skin: Negative.   Allergic/Immunologic: Negative.   Neurological: Negative.   Hematological: Negative.   Psychiatric/Behavioral: Negative.    All other systems reviewed and are negative.  Past Medical History:  Diagnosis Date   Colon polyps    Diverticulosis    ED (erectile dysfunction)    Hypertension    Hypothyroidism    Obese     Social History   Socioeconomic History   Marital status: Married    Spouse name: Not on file   Number  of children: Not on file   Years of education: Not on file   Highest education level: Not on file  Occupational History   Not on file  Tobacco Use   Smoking status: Never    Passive exposure: Never   Smokeless tobacco: Never  Vaping Use   Vaping Use: Never used  Substance and Sexual Activity   Alcohol use: No   Drug use: No   Sexual activity: Not on file  Other Topics Concern   Not on file  Social History Narrative   Geophysical data processor     Married    Social Determinants of Health   Financial Resource Strain: Low Risk  (01/25/2022)   Overall Financial Resource Strain (CARDIA)    Difficulty of Paying Living Expenses: Not hard at all  Food Insecurity: No Food Insecurity (01/25/2022)   Hunger Vital Sign    Worried About Running Out of Food in the Last Year: Never true    [place] in the Last Year: Never true  Transportation Needs: No Transportation Needs (01/25/2022)   PRAPARE - Hydrologist (Medical): No    Lack of Transportation (Non-Medical): No  Physical Activity: Sufficiently Active (01/25/2022)   Exercise Vital Sign    Days of Exercise per Week: 7 days    Minutes of Exercise per Session: 30 min  Stress: No Stress Concern Present (01/25/2022)   [place]    Feeling of Stress : Not at all  Social Connections: Moderately Integrated (01/16/2021)   Social Connection and Isolation Panel [NHANES]    Frequency of Communication with Friends and Family: Three times a week    Frequency of Social Gatherings with Friends and Family: Once a week    Attends Religious Services: Never    Marine scientist or Organizations: Yes    Attends Archivist Meetings: 1 to 4 times per year    Marital Status: Married  Human resources officer Violence: Not At Risk (01/16/2021)   Humiliation, Afraid, Rape, and Kick questionnaire    Fear of Current or Ex-Partner: No    Emotionally Abused: No    Physically Abused: No    Sexually Abused: No    Past Surgical History:  Procedure Laterality Date   CATARACT EXTRACTION, BILATERAL Bilateral    CHOLECYSTECTOMY      Family History  Problem Relation Age of Onset   Heart failure Mother    Hypertension Mother    Hypothyroidism Mother    Hodgkin's lymphoma Sister    Hypothyroidism Maternal Grandmother    Colon cancer Neg Hx    Stomach cancer Neg Hx     No Known Allergies  Current  Outpatient Medications on File Prior to Visit  Medication Sig Dispense Refill   aspirin 81 MG tablet Take 81 mg by mouth daily.     atenolol (TENORMIN) 25 MG tablet TAKE 1 TABLET BY MOUTH EVERY DAY 90 tablet 0   levothyroxine (SYNTHROID) 75 MCG tablet TAKE 1 TABLET BY MOUTH EVERY DAY 90 tablet 2   sildenafil (REVATIO) 20 MG tablet Take 1 to 2 tabs 2 - 3 hours before sex 30 tablet 11   chlorthalidone (HYGROTON) 25 MG tablet TAKE 1/2 TABLET BY MOUTH EVERY DAY (Patient not taking: Reported on 09/24/2022) 45 tablet 0   No current facility-administered medications on file prior to visit.    BP 112/78   Pulse 67   Temp 97.7 F (36.5 C) (  Oral)   Ht 5' 10.5" (1.791 m)   Wt 226 lb (102.5 kg)   SpO2 96%   BMI 31.97 kg/m       Objective:   Physical Exam Vitals and nursing note reviewed.  Constitutional:      General: He is not in acute distress.    Appearance: Normal appearance. He is well-developed and normal weight.  HENT:     Head: Normocephalic and atraumatic.     Right Ear: Tympanic membrane, ear canal and external ear normal. There is no impacted cerumen.     Left Ear: Tympanic membrane, ear canal and external ear normal. There is no impacted cerumen.     Nose: Rhinorrhea present. No congestion. Rhinorrhea is purulent.     Right Turbinates: Enlarged and swollen.     Left Turbinates: Enlarged and swollen.     Right Sinus: No maxillary sinus tenderness or frontal sinus tenderness.     Left Sinus: No maxillary sinus tenderness or frontal sinus tenderness.     Mouth/Throat:     Mouth: Mucous membranes are moist.     Pharynx: Oropharyngeal exudate present. No posterior oropharyngeal erythema.     Tonsils: No tonsillar exudate.  Eyes:     General:        Right eye: No discharge.        Left eye: No discharge.     Extraocular Movements: Extraocular movements intact.     Conjunctiva/sclera: Conjunctivae normal.     Pupils: Pupils are equal, round, and reactive to light.  Neck:      Vascular: No carotid bruit.     Trachea: No tracheal deviation.  Cardiovascular:     Rate and Rhythm: Normal rate and regular rhythm.     Pulses: Normal pulses.     Heart sounds: Normal heart sounds. No murmur heard.    No friction rub. No gallop.  Pulmonary:     Effort: Pulmonary effort is normal. No respiratory distress.     Breath sounds: Normal breath sounds. No stridor. No wheezing, rhonchi or rales.  Chest:     Chest wall: No tenderness.  Abdominal:     General: Bowel sounds are normal. There is no distension.     Palpations: Abdomen is soft. There is no mass.     Tenderness: There is no abdominal tenderness. There is no right CVA tenderness, left CVA tenderness, guarding or rebound.     Hernia: No hernia is present.  Musculoskeletal:        General: No swelling, tenderness, deformity or signs of injury. Normal range of motion.     Right lower leg: No edema.     Left lower leg: No edema.  Lymphadenopathy:     Cervical: No cervical adenopathy.  Skin:    General: Skin is warm and dry.     Capillary Refill: Capillary refill takes less than 2 seconds.     Coloration: Skin is not jaundiced or pale.     Findings: No bruising, erythema, lesion or rash.  Neurological:     General: No focal deficit present.     Mental Status: He is alert and oriented to person, place, and time.     Cranial Nerves: No cranial nerve deficit.     Sensory: No sensory deficit.     Motor: No weakness.     Coordination: Coordination normal.     Gait: Gait normal.     Deep Tendon Reflexes: Reflexes normal.  Psychiatric:  Mood and Affect: Mood normal.        Behavior: Behavior normal.        Thought Content: Thought content normal.        Judgment: Judgment normal.       Assessment & Plan:  1. Routine general medical examination at a health care facility - Continue to stay active and eat healthy  - CBC with Differential/Platelet; Future - Comprehensive metabolic panel; Future - Lipid  panel; Future - TSH; Future - TSH - Lipid panel - Comprehensive metabolic panel - CBC with Differential/Platelet  2. Hypothyroidism, unspecified type - consider increase in synthroid  - CBC with Differential/Platelet; Future - Comprehensive metabolic panel; Future - Lipid panel; Future - TSH; Future - TSH - Lipid panel - Comprehensive metabolic panel - CBC with Differential/Platelet  3. Essential hypertension - Well controlled. No change in medications  - CBC with Differential/Platelet; Future - Comprehensive metabolic panel; Future - Lipid panel; Future - TSH; Future - TSH - Lipid panel - Comprehensive metabolic panel - CBC with Differential/Platelet  4. Prostate cancer screening  - PSA; Future - PSA  5. Mixed hyperlipidemia - Consider increase in statin  - CBC with Differential/Platelet; Future - Comprehensive metabolic panel; Future - Lipid panel; Future - TSH; Future - TSH - Lipid panel - Comprehensive metabolic panel - CBC with Differential/Platelet   6. Acute recurrent pansinusitis - Will treat with augmentin and prednisone. Can consider CT sinus if symptoms do not improve  - amoxicillin-clavulanate (AUGMENTIN) 875-125 MG tablet; Take 1 tablet by mouth 2 (two) times daily.  Dispense: 20 tablet; Refill: 0 - methylPREDNISolone (MEDROL DOSEPAK) 4 MG TBPK tablet; Take as directed  Dispense: 21 tablet; Refill: 0  Dorothyann Peng, NP

## 2022-10-08 ENCOUNTER — Other Ambulatory Visit: Payer: Self-pay | Admitting: Adult Health

## 2022-11-10 ENCOUNTER — Ambulatory Visit: Payer: Medicare HMO | Admitting: Adult Health

## 2022-11-11 ENCOUNTER — Ambulatory Visit (INDEPENDENT_AMBULATORY_CARE_PROVIDER_SITE_OTHER): Payer: Medicare HMO | Admitting: Adult Health

## 2022-11-11 ENCOUNTER — Encounter: Payer: Self-pay | Admitting: Adult Health

## 2022-11-11 VITALS — BP 120/88 | HR 53 | Temp 98.0°F | Ht 70.5 in | Wt 235.0 lb

## 2022-11-11 DIAGNOSIS — Z23 Encounter for immunization: Secondary | ICD-10-CM

## 2022-11-11 DIAGNOSIS — R051 Acute cough: Secondary | ICD-10-CM | POA: Diagnosis not present

## 2022-11-11 MED ORDER — AZELASTINE HCL 0.1 % NA SOLN: 2.0000 | Freq: Two times a day (BID) | NASAL | 2 refills | Status: DC

## 2022-11-11 MED ORDER — PREDNISONE 10 MG PO TABS: ORAL_TABLET | ORAL | 0 refills | Status: DC

## 2022-11-11 MED ORDER — HYDROCODONE BIT-HOMATROP MBR 5-1.5 MG/5ML PO SOLN: 5.0000 mL | Freq: Three times a day (TID) | ORAL | 0 refills | Status: DC | PRN

## 2022-11-11 NOTE — Progress Notes (Signed)
Subjective:    Patient ID: Edward Miles, male    DOB: Oct 18, 1946, 76 y.o.   MRN: [id number]  HPI 76 year old male who  has a past medical history of Colon polyps, Diverticulosis, ED (erectile dysfunction), Hypertension, Hypothyroidism, and Obese.  He presents to the office today for an acute issue of productive cough with clear mucus x 3-4 weeks. His wife reports wheezing at night. He feels as though his cough is worse in the morning and at night when he is laying down. He does have a cough throughout the day. Cough keeps him up at night. Associated symptoms include PND.   At home he has been using OTC cough syrup that does not work.   Denies fevers, chills, sinus pain or pressure or feeling acutely ill.   Review of Systems See HPI   Past Medical History:  Diagnosis Date   Colon polyps    Diverticulosis    ED (erectile dysfunction)    Hypertension    Hypothyroidism    Obese     Social History   Socioeconomic History   Marital status: Married    Spouse name: Not on file   Number of children: Not on file   Years of education: Not on file   Highest education level: Not on file  Occupational History   Not on file  Tobacco Use   Smoking status: Never    Passive exposure: Never   Smokeless tobacco: Never  Vaping Use   Vaping Use: Never used  Substance and Sexual Activity   Alcohol use: No   Drug use: No   Sexual activity: Not on file  Other Topics Concern   Not on file  Social History Narrative   Geophysical data processor    Married    Social Determinants of Health   Financial Resource Strain: Low Risk  (01/25/2022)   Overall Financial Resource Strain (CARDIA)    Difficulty of Paying Living Expenses: Not hard at all  Food Insecurity: No Food Insecurity (01/25/2022)   Hunger Vital Sign    Worried About Running Out of Food in the Last Year: Never true    [place] in the Last Year: Never true  Transportation Needs: No Transportation Needs (01/25/2022)    PRAPARE - Hydrologist (Medical): No    Lack of Transportation (Non-Medical): No  Physical Activity: Sufficiently Active (01/25/2022)   Exercise Vital Sign    Days of Exercise per Week: 7 days    Minutes of Exercise per Session: 30 min  Stress: No Stress Concern Present (01/25/2022)   [place]    Feeling of Stress : Not at all  Social Connections: Moderately Integrated (01/16/2021)   Social Connection and Isolation Panel [NHANES]    Frequency of Communication with Friends and Family: Three times a week    Frequency of Social Gatherings with Friends and Family: Once a week    Attends Religious Services: Never    Marine scientist or Organizations: Yes    Attends Archivist Meetings: 1 to 4 times per year    Marital Status: Married  Human resources officer Violence: Not At Risk (01/16/2021)   Humiliation, Afraid, Rape, and Kick questionnaire    Fear of Current or Ex-Partner: No    Emotionally Abused: No    Physically Abused: No    Sexually Abused: No    Past Surgical History:  Procedure Laterality Date  CATARACT EXTRACTION, BILATERAL Bilateral    CHOLECYSTECTOMY      Family History  Problem Relation Age of Onset   Heart failure Mother    Hypertension Mother    Hypothyroidism Mother    Hodgkin's lymphoma Sister    Hypothyroidism Maternal Grandmother    Colon cancer Neg Hx    Stomach cancer Neg Hx     No Known Allergies  Current Outpatient Medications on File Prior to Visit  Medication Sig Dispense Refill   aspirin 81 MG tablet Take 81 mg by mouth daily.     atenolol (TENORMIN) 25 MG tablet TAKE 1 TABLET BY MOUTH EVERY DAY 90 tablet 0   chlorthalidone (HYGROTON) 25 MG tablet TAKE 1/2 TABLET BY MOUTH EVERY DAY (Patient not taking: Reported on 09/24/2022) 45 tablet 0   levothyroxine (SYNTHROID) 75 MCG tablet TAKE 1 TABLET BY MOUTH EVERY DAY 90 tablet 2   sildenafil  (REVATIO) 20 MG tablet Take 1 to 2 tabs 2 - 3 hours before sex 30 tablet 11   No current facility-administered medications on file prior to visit.    BP 120/88   Pulse (!) 53   Temp 98 F (36.7 C) (Oral)   Ht 5' 10.5" (1.791 m)   Wt 235 lb (106.6 kg)   SpO2 98%   BMI 33.24 kg/m       Objective:   Physical Exam Vitals and nursing note reviewed.  Constitutional:      Appearance: Normal appearance.  HENT:     Right Ear: Tympanic membrane, ear canal and external ear normal.     Left Ear: Tympanic membrane, ear canal and external ear normal.     Nose: Rhinorrhea (clear) present. No congestion.     Mouth/Throat:     Mouth: Mucous membranes are dry.     Pharynx: Oropharynx is clear.  Cardiovascular:     Rate and Rhythm: Normal rate and regular rhythm.     Pulses: Normal pulses.     Heart sounds: Normal heart sounds.  Pulmonary:     Effort: Pulmonary effort is normal.     Breath sounds: Normal breath sounds.  Musculoskeletal:        General: Normal range of motion.  Skin:    General: Skin is warm and dry.     Capillary Refill: Capillary refill takes less than 2 seconds.  Neurological:     Mental Status: He is alert.  Psychiatric:        Mood and Affect: Mood normal.        Behavior: Behavior normal.        Thought Content: Thought content normal.        Judgment: Judgment normal.       Assessment & Plan:  1. Acute cough - Likely viral URI.  Will treat with prednisone, astelin nasal spray and hycodan cough syrup. He was advised on sedating effect of cough syrup  - predniSONE (DELTASONE) 10 MG tablet; 40 mg x 3 days, 20 mg x 3 days, 10 mg x 3 days  Dispense: 21 tablet; Refill: 0 - azelastine (ASTELIN) 0.1 % nasal spray; Place 2 sprays into both nostrils 2 (two) times daily. Use in each nostril as directed  Dispense: 30 mL; Refill: 2 - HYDROcodone bit-homatropine (HYCODAN) 5-1.5 MG/5ML syrup; Take 5 mLs by mouth every 8 (eight) hours as needed for cough.  Dispense: 120  mL; Refill: 0  Dorothyann Peng, NP

## 2023-01-05 ENCOUNTER — Other Ambulatory Visit: Payer: Self-pay | Admitting: Adult Health

## 2023-01-06 ENCOUNTER — Other Ambulatory Visit: Payer: Self-pay | Admitting: Adult Health

## 2023-01-21 DIAGNOSIS — R69 Illness, unspecified: Secondary | ICD-10-CM | POA: Diagnosis not present

## 2023-01-26 ENCOUNTER — Ambulatory Visit (INDEPENDENT_AMBULATORY_CARE_PROVIDER_SITE_OTHER): Payer: Medicare HMO

## 2023-01-26 VITALS — Ht 70.5 in | Wt 235.0 lb

## 2023-01-26 DIAGNOSIS — Z0000 Encounter for general adult medical examination without abnormal findings: Secondary | ICD-10-CM

## 2023-01-26 NOTE — Progress Notes (Signed)
Subjective:   Edward Miles is a 77 y.o. male who presents for Medicare Annual/Subsequent preventive examination.  Review of Systems    Virtual Visit via Telephone Note  I connected with  Edward Miles on 01/26/23 at  9:15 AM EST by telephone and verified that I am speaking with the correct person using two identifiers.  Location: Patient: Home Provider: Office Persons participating in the virtual visit: patient/Nurse Health Advisor   I discussed the limitations, risks, security and privacy concerns of performing an evaluation and management service by telephone and the availability of in person appointments. The patient expressed understanding and agreed to proceed.  Interactive audio and video telecommunications were attempted between this nurse and patient, however failed, due to patient having technical difficulties OR patient did not have access to video capability.  We continued and completed visit with audio only.  Some vital signs may be absent or patient reported.   Criselda Peaches, LPN  Cardiac Risk Factors include: advanced age (>4mn, >>58women);hypertension;male gender     Objective:    Today's Vitals   01/26/23 0923  Weight: 235 lb (106.6 kg)  Height: 5' 10.5" (1.791 m)   Body mass index is 33.24 kg/m.     01/26/2023    9:30 AM 01/25/2022    1:46 PM 01/16/2021   10:02 AM  Advanced Directives  Does Patient Have a Medical Advance Directive? Yes Yes Yes  Type of [place]  Does patient want to make changes to medical advance directive?   No - Patient declined  Copy of [place] Chart? No - copy requested No - copy requested No - copy requested    Current Medications (verified) Outpatient Encounter Medications as of 01/26/2023  Medication Sig   aspirin 81 MG tablet Take 81 mg by mouth daily.    atenolol (TENORMIN) 25 MG tablet TAKE 1 TABLET BY MOUTH EVERY DAY   azelastine (ASTELIN) 0.1 % nasal spray Place 2 sprays into both nostrils 2 (two) times daily. Use in each nostril as directed   chlorthalidone (HYGROTON) 25 MG tablet TAKE 1/2 TABLET BY MOUTH EVERY DAY (Patient not taking: Reported on 09/24/2022)   HYDROcodone bit-homatropine (HYCODAN) 5-1.5 MG/5ML syrup Take 5 mLs by mouth every 8 (eight) hours as needed for cough.   levothyroxine (SYNTHROID) 75 MCG tablet TAKE 1 TABLET BY MOUTH EVERY DAY   predniSONE (DELTASONE) 10 MG tablet 40 mg x 3 days, 20 mg x 3 days, 10 mg x 3 days   sildenafil (REVATIO) 20 MG tablet Take 1 to 2 tabs 2 - 3 hours before sex   No facility-administered encounter medications on file as of 01/26/2023.    Allergies (verified) Patient has no known allergies.   History: Past Medical History:  Diagnosis Date   Colon polyps    Diverticulosis    ED (erectile dysfunction)    Hypertension    Hypothyroidism    Obese    Past Surgical History:  Procedure Laterality Date   CATARACT EXTRACTION, BILATERAL Bilateral    CHOLECYSTECTOMY     Family History  Problem Relation Age of Onset   Heart failure Mother    Hypertension Mother    Hypothyroidism Mother    Hodgkin's lymphoma Sister    Hypothyroidism Maternal Grandmother    Colon cancer Neg Hx    Stomach cancer Neg Hx    Social History  Socioeconomic History   Marital status: Married    Spouse name: Not on file   Number of children: Not on file   Years of education: Not on file   Highest education level: Not on file  Occupational History   Not on file  Tobacco Use   Smoking status: Never    Passive exposure: Never   Smokeless tobacco: Never  Vaping Use   Vaping Use: Never used  Substance and Sexual Activity   Alcohol use: No   Drug use: No   Sexual activity: Not on file  Other Topics Concern   Not on file  Social History Narrative   Geophysical data processor    Married    Social  Determinants of Health   Financial Resource Strain: Low Risk  (01/26/2023)   Overall Financial Resource Strain (CARDIA)    Difficulty of Paying Living Expenses: Not hard at all  Food Insecurity: No Food Insecurity (01/26/2023)   Hunger Vital Sign    Worried About Running Out of Food in the Last Year: Never true    [place] in the Last Year: Never true  Transportation Needs: No Transportation Needs (01/26/2023)   PRAPARE - Hydrologist (Medical): No    Lack of Transportation (Non-Medical): No  Physical Activity: Sufficiently Active (01/26/2023)   Exercise Vital Sign    Days of Exercise per Week: 2 days    Minutes of Exercise per Session: 150+ min  Stress: No Stress Concern Present (01/26/2023)   [place]    Feeling of Stress : Not at all  Social Connections: Moderately Isolated (01/26/2023)   Social Connection and Isolation Panel [NHANES]    Frequency of Communication with Friends and Family: More than three times a week    Frequency of Social Gatherings with Friends and Family: More than three times a week    Attends Religious Services: Never    Marine scientist or Organizations: No    Attends Music therapist: Never    Marital Status: Married    Tobacco Counseling Counseling given: Not Answered   Clinical Intake:  Pre-visit preparation completed: Yes  Pain : No/denies pain     BMI - recorded: 33.24 Nutritional Status: BMI > 30  Obese Nutritional Risks: None Diabetes: No  How often do you need to have someone help you when you read instructions, pamphlets, or other written materials from your doctor or pharmacy?: 1 - Never  Diabetic?  No  Interpreter Needed?: No  Information entered by :: Rolene Arbour LPN   Activities of Daily Living    01/26/2023    9:28 AM 09/24/2022    7:59 AM  In your present state of health, do you have any difficulty  performing the following activities:  Hearing? 0 0  Vision? 0 0  Difficulty concentrating or making decisions? 0 0  Walking or climbing stairs? 0 0  Dressing or bathing? 0 0  Doing errands, shopping? 0 0  Preparing Food and eating ? N   Using the Toilet? N   In the past six months, have you accidently leaked urine? N   Do you have problems with loss of bowel control? N   Managing your Medications? N   Managing your Finances? N   Housekeeping or managing your Housekeeping? N     Patient Care Team: Dorothyann Peng, NP as PCP - General (Family Medicine) [facility] as Consulting Physician (Orthopedic Surgery)  Indicate any recent Medical Services you may have received from other than [facility] providers in the past year (date may be approximate).     Assessment:   This is a routine wellness examination for Edward Miles.  Hearing/Vision screen Hearing Screening - Comments:: Denies hearing difficulties   Vision Screening - Comments:: Wears reading glasses - up to date with routine eye exams with  Dr Sherral Hammers  Dietary issues and exercise activities discussed: Current Exercise Habits: Home exercise routine, Type of exercise: Other - see comments (Play golf), Time (Minutes): > 60, Frequency (Times/Week): 2, Weekly Exercise (Minutes/Week): 0, Intensity: Moderate, Exercise limited by: None identified   Goals Addressed               This Visit's Progress     Stay Healthy (pt-stated)         Depression Screen    01/26/2023    9:28 AM 01/25/2022    1:47 PM 01/16/2021   10:04 AM 07/09/2020    8:30 AM 09/07/2018   11:25 AM 03/14/2017    2:14 PM 03/08/2016    1:49 PM  PHQ 2/9 Scores  PHQ - 2 Score 0 0 0 0 0 0 0    Fall Risk    01/26/2023    9:29 AM 09/24/2022    7:59 AM 01/25/2022    1:46 PM 01/16/2021   10:03 AM 07/09/2020    8:29 AM  Fall Risk   Falls in the past year? 0 0 1 0 1  Comment   tripped hiking    Number falls in past yr: 0 0 1 0 0  Injury with Fall? 0 0 0 0   Risk for fall due to  : No Fall Risks No Fall Risks Medication side effect No Fall Risks   Follow up Falls prevention discussed Falls evaluation completed Falls evaluation completed;Education provided;Falls prevention discussed Falls evaluation completed;Falls prevention discussed     FALL RISK PREVENTION PERTAINING TO THE HOME:  Any stairs in or around the home? Yes  If so, are there any without handrails? No  Home free of loose throw rugs in walkways, pet beds, electrical cords, etc? Yes  Adequate lighting in your home to reduce risk of falls? Yes   ASSISTIVE DEVICES UTILIZED TO PREVENT FALLS:  Life alert? No  Use of a cane, walker or w/c? No  Grab bars in the bathroom? Yes Shower chair or bench in shower? Yes  Elevated toilet seat or a handicapped toilet? No   TIMED UP AND GO:  Was the test performed? No . Audio Visit  Cognitive Function:        01/26/2023    9:30 AM  6CIT Screen  What Year? 0 points  What month? 0 points  What time? 0 points  Count back from 20 0 points  Months in reverse 0 points  Repeat phrase 0 points  Total Score 0 points    Immunizations Immunization History  Administered Date(s) Administered   Fluad Quad(high Dose 65+) 09/02/2021, 11/11/2022   Influenza Split 09/14/2011   Influenza Whole 08/03/2010   Influenza, High Dose Seasonal PF 12/12/2014, 09/07/2018   Influenza, Seasonal, Injecte, Preservative Fre 11/21/2012   Influenza,inj,Quad PF,6+ Mos 11/22/2013   PFIZER(Purple Top)SARS-COV-2 Vaccination 12/27/2019, 01/16/2020, 09/19/2020   Pfizer Covid-19 Vaccine Bivalent Booster 55yr & up 09/04/2021   Pneumococcal Conjugate-13 11/22/2013   Pneumococcal Polysaccharide-23 03/08/2016   Td 03/21/2006   Tdap 11/22/2013   Zoster Recombinat (Shingrix) 10/18/2021, 01/15/2022   Zoster, Live 09/14/2011  TDAP status: Up to date  Flu Vaccine status: Up to date  Pneumococcal vaccine status: Up to date  Covid-19 vaccine status: Completed vaccines  Qualifies for  Shingles Vaccine? Yes   Zostavax completed Yes   Shingrix Completed?: Yes  Screening Tests Health Maintenance  Topic Date Due   COVID-19 Vaccine (5 - 2023-24 season) 02/11/2023 (Originally 08/06/2022)   DTaP/Tdap/Td (3 - Td or Tdap) 11/23/2023   COLONOSCOPY (Pts 45-50yr Insurance coverage will need to be confirmed)  01/12/2024   Medicare Annual Wellness (AWV)  01/27/2024   Pneumonia Vaccine 75 Years old  Completed   INFLUENZA VACCINE  Completed   Hepatitis C Screening  Completed   Zoster Vaccines- Shingrix  Completed   HPV VACCINES  Aged Out    Health Maintenance  There are no preventive care reminders to display for this patient.   Colorectal cancer screening: Type of screening: Colonoscopy. Completed 01/11/14. Repeat every 10 years  Lung Cancer Screening: (Low Dose CT Chest recommended if Age 75-80years, 30 pack-year currently smoking OR have quit w/in 15years.) does not qualify.     Additional Screening:  Hepatitis C Screening: does qualify; Completed 07/09/20  Vision Screening: Recommended annual ophthalmology exams for early detection of glaucoma and other disorders of the eye. Is the patient up to date with their annual eye exam?  Yes Who is the provider or what is the name of the office in which the patient attends annual eye exams? Dr WSherral HammersIf pt is not established with a provider, would they like to be referred to a provider to establish care? No .   Dental Screening: Recommended annual dental exams for proper oral hygiene  Community Resource Referral / Chronic Care Management:  CRR required this visit?  No   CCM required this visit?  No      Plan:     I have personally reviewed and noted the following in the patient's chart:   Medical and social history Use of alcohol, tobacco or illicit drugs  Current medications and supplements including opioid prescriptions. Patient is currently taking opioid prescriptions. Information provided to patient regarding  non-opioid alternatives. Patient advised to discuss non-opioid treatment plan with their provider. Functional ability and status Nutritional status Physical activity Advanced directives List of other physicians Hospitalizations, surgeries, and ER visits in previous 12 months Vitals Screenings to include cognitive, depression, and falls Referrals and appointments  In addition, I have reviewed and discussed with patient certain preventive protocols, quality metrics, and best practice recommendations. A written personalized care plan for preventive services as well as general preventive health recommendations were provided to patient.     BCriselda Peaches LPN   [id number]  Nurse Notes: None

## 2023-01-26 NOTE — Patient Instructions (Addendum)
[name] , Thank you for taking time to come for your Medicare Wellness Visit. I appreciate your ongoing commitment to your health goals. Please review the following plan we discussed and let me know if I can assist you in the future.   These are the goals we discussed:  Goals       Exercise 3x per week (30 min per time)      Patient Stated      01/25/2022, keep living      Stay Healthy (pt-stated)        This is a list of the screening recommended for you and due dates:  Health Maintenance  Topic Date Due   COVID-19 Vaccine (5 - 2023-24 season) 02/11/2023*   DTaP/Tdap/Td vaccine (3 - Td or Tdap) 11/23/2023   Colon Cancer Screening  01/12/2024   Medicare Annual Wellness Visit  01/27/2024   Pneumonia Vaccine  Completed   Flu Shot  Completed   Hepatitis C Screening: USPSTF Recommendation to screen - Ages 18-79 yo.  Completed   Zoster (Shingles) Vaccine  Completed   HPV Vaccine  Aged Out  *Topic was postponed. The date shown is not the original due date.  Opioid Pain Medicine Management Opioids are powerful medicines that are used to treat moderate to severe pain. When used for short periods of time, they can help you to: Sleep better. Do better in physical or occupational therapy. Feel better in the first few days after an injury. Recover from surgery. Opioids should be taken with the supervision of a trained health care provider. They should be taken for the shortest period of time possible. This is because opioids can be addictive, and the longer you take opioids, the greater your risk of addiction. This addiction can also be called opioid use disorder. What are the risks? Using opioid pain medicines for longer than 3 days increases your risk of side effects. Side effects include: Constipation. Nausea and vomiting. Breathing difficulties (respiratory depression). Drowsiness. Confusion. Opioid use disorder. Itching. Taking opioid pain medicine for a long period of time  can affect your ability to do daily tasks. It also puts you at risk for: Motor vehicle crashes. Depression. Suicide. Heart attack. Overdose, which can be life-threatening. What is a pain treatment plan? A pain treatment plan is an agreement between you and your health care provider. Pain is unique to each person, and treatments vary depending on your condition. To manage your pain, you and your health care provider need to work together. To help you do this: Discuss the goals of your treatment, including how much pain you might expect to have and how you will manage the pain. Review the risks and benefits of taking opioid medicines. Remember that a good treatment plan uses more than one approach and minimizes the chance of side effects. Be honest about the amount of medicines you take and about any drug or alcohol use. Get pain medicine prescriptions from only one health care provider. Pain can be managed with many types of alternative treatments. Ask your health care provider to refer you to one or more specialists who can help you manage pain through: Physical or occupational therapy. Counseling (cognitive behavioral therapy). Good nutrition. Biofeedback. Massage. Meditation. Non-opioid medicine. Following a gentle exercise program. How to use opioid pain medicine Taking medicine Take your pain medicine exactly as told by your health care provider. Take it only when you need it. If your pain gets less severe, you may take less than your prescribed  dose if your health care provider approves. If you are not having pain, do nottake pain medicine unless your health care provider tells you to take it. If your pain is severe, do nottry to treat it yourself by taking more pills than instructed on your prescription. Contact your health care provider for help. Write down the times when you take your pain medicine. It is easy to become confused while on pain medicine. Writing the time can help  you avoid overdose. Take other over-the-counter or prescription medicines only as told by your health care provider. Keeping yourself and others safe  While you are taking opioid pain medicine: Do not drive, use machinery, or power tools. Do not sign legal documents. Do not drink alcohol. Do not take sleeping pills. Do not supervise children by yourself. Do not do activities that require climbing or being in high places. Do not go to a lake, river, ocean, spa, or swimming pool. Do not share your pain medicine with anyone. Keep pain medicine in a locked cabinet or in a secure area where pets and children cannot reach it. Stopping your use of opioids If you have been taking opioid medicine for more than a few weeks, you may need to slowly decrease (taper) how much you take until you stop completely. Tapering your use of opioids can decrease your risk of symptoms of withdrawal, such as: Pain and cramping in the abdomen. Nausea. Sweating. Sleepiness. Restlessness. Uncontrollable shaking (tremors). Cravings for the medicine. Do not attempt to taper your use of opioids on your own. Talk with your health care provider about how to do this. Your health care provider may prescribe a step-down schedule based on how much medicine you are taking and how long you have been taking it. Getting rid of leftover pills Do not save any leftover pills. Get rid of leftover pills safely by: Taking the medicine to a prescription take-back program. This is usually offered by the county or law enforcement. Bringing them to a pharmacy that has a drug disposal container. Flushing them down the toilet. Check the label or package insert of your medicine to see whether this is safe to do. Throwing them out in the trash. Check the label or package insert of your medicine to see whether this is safe to do. If it is safe to throw it out, remove the medicine from the original container, put it into a sealable bag or  container, and mix it with used coffee grounds, food scraps, dirt, or cat litter before putting it in the trash. Follow these instructions at home: Activity Do exercises as told by your health care provider. Avoid activities that make your pain worse. Return to your normal activities as told by your health care provider. Ask your health care provider what activities are safe for you. General instructions You may need to take these actions to prevent or treat constipation: Drink enough fluid to keep your urine pale yellow. Take over-the-counter or prescription medicines. Eat foods that are high in fiber, such as beans, whole grains, and fresh fruits and vegetables. Limit foods that are high in fat and processed sugars, such as fried or sweet foods. Keep all follow-up visits. This is important. Where to find support If you have been taking opioids for a long time, you may benefit from receiving support for quitting from a local support group or counselor. Ask your health care provider for a referral to these resources in your area. Where to find more information Centers for  Disease Control and Prevention (CDC): http://www.wolf.info/ [place] Food and Drug Administration (FDA): [place] Get help right away if: You may have taken too much of an opioid (overdosed). Common symptoms of an overdose: Your breathing is slower or more shallow than normal. You have a very slow heartbeat (pulse). You have slurred speech. You have nausea and vomiting. Your pupils become very small. You have other potential symptoms: You are very confused. You faint or feel like you will faint. You have cold, clammy skin. You have blue lips or fingernails. You have thoughts of harming yourself or harming others. These symptoms may represent a serious problem that is an emergency. Do not wait to see if the symptoms will go away. Get medical help right away. Call your local emergency services (911 in the [place] Do not drive  yourself to the hospital.  If you ever feel like you may hurt yourself or others, or have thoughts about taking your own life, get help right away. Go to your nearest emergency department or: Call your local emergency services (911 in the [place] Call the [facility] 463-737-4234 in the [place] Call a suicide crisis helpline, such as the [facility] at (812)053-8347 or 988 in the [place]. This is open 24 hours a day in the [place] Text the Crisis Text Line at 847 879 8956 (in the [place].). Summary Opioid medicines can help you manage moderate to severe pain for a short period of time. A pain treatment plan is an agreement between you and your health care provider. Discuss the goals of your treatment, including how much pain you might expect to have and how you will manage the pain. If you think that you or someone else may have taken too much of an opioid, get medical help right away. This information is not intended to replace advice given to you by your health care provider. Make sure you discuss any questions you have with your health care provider. Document Revised: 06/17/2021 Document Reviewed: 03/04/2021 Elsevier Patient Education  [place] directives: Please bring a copy of your health care power of attorney and living will to the office to be added to your chart at your convenience.   Conditions/risks identified: None  Next appointment: Follow up in one year for your annual wellness visit.    Preventive Care 11 Years and Older, Male  Preventive care refers to lifestyle choices and visits with your health care provider that can promote health and wellness. What does preventive care include? A yearly physical exam. This is also called an annual well check. Dental exams once or twice a year. Routine eye exams. Ask your health care provider how often you should have your eyes checked. Personal lifestyle choices, including: Daily  care of your teeth and gums. Regular physical activity. Eating a healthy diet. Avoiding tobacco and drug use. Limiting alcohol use. Practicing safe sex. Taking low doses of aspirin every day. Taking vitamin and mineral supplements as recommended by your health care provider. What happens during an annual well check? The services and screenings done by your health care provider during your annual well check will depend on your age, overall health, lifestyle risk factors, and family history of disease. Counseling  Your health care provider may ask you questions about your: Alcohol use. Tobacco use. Drug use. Emotional well-being. Home and relationship well-being. Sexual activity. Eating habits. History of falls. Memory and ability to understand (cognition). Work and work Statistician. Screening  You may have the  following tests or measurements: Height, weight, and BMI. Blood pressure. Lipid and cholesterol levels. These may be checked every 5 years, or more frequently if you are over 75 years old. Skin check. Lung cancer screening. You may have this screening every year starting at age 48 if you have a 30-pack-year history of smoking and currently smoke or have quit within the past 15 years. Fecal occult blood test (FOBT) of the stool. You may have this test every year starting at age 55. Flexible sigmoidoscopy or colonoscopy. You may have a sigmoidoscopy every 5 years or a colonoscopy every 10 years starting at age 57. Prostate cancer screening. Recommendations will vary depending on your family history and other risks. Hepatitis C blood test. Hepatitis B blood test. Sexually transmitted disease (STD) testing. Diabetes screening. This is done by checking your blood sugar (glucose) after you have not eaten for a while (fasting). You may have this done every 1-3 years. Abdominal aortic aneurysm (AAA) screening. You may need this if you are a current or former smoker. Osteoporosis.  You may be screened starting at age 38 if you are at high risk. Talk with your health care provider about your test results, treatment options, and if necessary, the need for more tests. Vaccines  Your health care provider may recommend certain vaccines, such as: Influenza vaccine. This is recommended every year. Tetanus, diphtheria, and acellular pertussis (Tdap, Td) vaccine. You may need a Td booster every 10 years. Zoster vaccine. You may need this after age 53. Pneumococcal 13-valent conjugate (PCV13) vaccine. One dose is recommended after age 72. Pneumococcal polysaccharide (PPSV23) vaccine. One dose is recommended after age 33. Talk to your health care provider about which screenings and vaccines you need and how often you need them. This information is not intended to replace advice given to you by your health care provider. Make sure you discuss any questions you have with your health care provider. Document Released: 12/19/2015 Document Revised: 08/11/2016 Document Reviewed: 09/23/2015 Elsevier Interactive Patient Education  2017 [employer] Prevention in the Home Falls can cause injuries. They can happen to people of all ages. There are many things you can do to make your home safe and to help prevent falls. What can I do on the outside of my home? Regularly fix the edges of walkways and driveways and fix any cracks. Remove anything that might make you trip as you walk through a door, such as a raised step or threshold. Trim any bushes or trees on the path to your home. Use bright outdoor lighting. Clear any walking paths of anything that might make someone trip, such as rocks or tools. Regularly check to see if handrails are loose or broken. Make sure that both sides of any steps have handrails. Any raised decks and porches should have guardrails on the edges. Have any leaves, snow, or ice cleared regularly. Use sand or salt on walking paths during winter. Clean up any  spills in your garage right away. This includes oil or grease spills. What can I do in the bathroom? Use night lights. Install grab bars by the toilet and in the tub and shower. Do not use towel bars as grab bars. Use non-skid mats or decals in the tub or shower. If you need to sit down in the shower, use a plastic, non-slip stool. Keep the floor dry. Clean up any water that spills on the floor as soon as it happens. Remove soap buildup in the tub or shower  regularly. Attach bath mats securely with double-sided non-slip rug tape. Do not have throw rugs and other things on the floor that can make you trip. What can I do in the bedroom? Use night lights. Make sure that you have a light by your bed that is easy to reach. Do not use any sheets or blankets that are too big for your bed. They should not hang down onto the floor. Have a firm chair that has side arms. You can use this for support while you get dressed. Do not have throw rugs and other things on the floor that can make you trip. What can I do in the kitchen? Clean up any spills right away. Avoid walking on wet floors. Keep items that you use a lot in easy-to-reach places. If you need to reach something above you, use a strong step stool that has a grab bar. Keep electrical cords out of the way. Do not use floor polish or wax that makes floors slippery. If you must use wax, use non-skid floor wax. Do not have throw rugs and other things on the floor that can make you trip. What can I do with my stairs? Do not leave any items on the stairs. Make sure that there are handrails on both sides of the stairs and use them. Fix handrails that are broken or loose. Make sure that handrails are as long as the stairways. Check any carpeting to make sure that it is firmly attached to the stairs. Fix any carpet that is loose or worn. Avoid having throw rugs at the top or bottom of the stairs. If you do have throw rugs, attach them to the floor  with carpet tape. Make sure that you have a light switch at the top of the stairs and the bottom of the stairs. If you do not have them, ask someone to add them for you. What else can I do to help prevent falls? Wear shoes that: Do not have high heels. Have rubber bottoms. Are comfortable and fit you well. Are closed at the toe. Do not wear sandals. If you use a stepladder: Make sure that it is fully opened. Do not climb a closed stepladder. Make sure that both sides of the stepladder are locked into place. Ask someone to hold it for you, if possible. Clearly mark and make sure that you can see: Any grab bars or handrails. First and last steps. Where the edge of each step is. Use tools that help you move around (mobility aids) if they are needed. These include: Canes. Walkers. Scooters. Crutches. Turn on the lights when you go into a dark area. Replace any light bulbs as soon as they burn out. Set up your furniture so you have a clear path. Avoid moving your furniture around. If any of your floors are uneven, fix them. If there are any pets around you, be aware of where they are. Review your medicines with your doctor. Some medicines can make you feel dizzy. This can increase your chance of falling. Ask your doctor what other things that you can do to help prevent falls. This information is not intended to replace advice given to you by your health care provider. Make sure you discuss any questions you have with your health care provider. Document Released: 09/18/2009 Document Revised: 04/29/2016 Document Reviewed: 12/27/2014 Elsevier Interactive Patient Education  2017 [employer].

## 2023-02-05 ENCOUNTER — Other Ambulatory Visit: Payer: Self-pay | Admitting: Adult Health

## 2023-02-05 DIAGNOSIS — R051 Acute cough: Secondary | ICD-10-CM

## 2023-02-21 ENCOUNTER — Other Ambulatory Visit: Payer: Self-pay | Admitting: Adult Health

## 2023-02-21 DIAGNOSIS — R051 Acute cough: Secondary | ICD-10-CM

## 2023-03-07 ENCOUNTER — Other Ambulatory Visit: Payer: Self-pay | Admitting: Adult Health

## 2023-03-07 DIAGNOSIS — R051 Acute cough: Secondary | ICD-10-CM

## 2023-07-06 ENCOUNTER — Encounter: Payer: Self-pay | Admitting: Family Medicine

## 2023-07-06 ENCOUNTER — Ambulatory Visit (INDEPENDENT_AMBULATORY_CARE_PROVIDER_SITE_OTHER): Payer: Medicare HMO | Admitting: Family Medicine

## 2023-07-06 VITALS — BP 110/70 | HR 50 | Temp 98.1°F | Ht 70.5 in | Wt 237.4 lb

## 2023-07-06 DIAGNOSIS — N410 Acute prostatitis: Secondary | ICD-10-CM

## 2023-07-06 DIAGNOSIS — R339 Retention of urine, unspecified: Secondary | ICD-10-CM | POA: Diagnosis not present

## 2023-07-06 LAB — POC URINALSYSI DIPSTICK (AUTOMATED)
Bilirubin, UA: NEGATIVE
Glucose, UA: NEGATIVE
Ketones, UA: NEGATIVE
Leukocytes, UA: NEGATIVE
Nitrite, UA: NEGATIVE
Protein, UA: NEGATIVE
Spec Grav, UA: 1.015 (ref 1.010–1.025)
Urobilinogen, UA: 0.2 E.U./dL
pH, UA: 6 (ref 5.0–8.0)

## 2023-07-06 MED ORDER — DOXYCYCLINE HYCLATE 100 MG PO CAPS: 100.0000 mg | ORAL_CAPSULE | Freq: Two times a day (BID) | ORAL | 0 refills | Status: AC

## 2023-07-06 NOTE — Progress Notes (Signed)
   Subjective:    Patient ID: Edward Miles, male    DOB: October 31, 1946, 77 y.o.   MRN: [id number]  HPI Here for the onset this morning of an urge to urinate and an urge to defecate, but he could do neither. He also had a severe lower abdominal pain for a few hours. No fever. He was nauseated but did not vomit. Then a few hours ago the pain stopped, and he was able to pass urine and stool. No blood was seen. No hx of kidney stones. At the moment her feels fine.    Review of Systems  Constitutional: Negative.   Respiratory: Negative.    Cardiovascular: Negative.   Gastrointestinal:  Positive for nausea. Negative for abdominal distention, blood in stool, constipation, diarrhea and vomiting.  Genitourinary:  Positive for difficulty urinating and urgency. Negative for dysuria, flank pain, frequency, hematuria and testicular pain.       Objective:   Physical Exam Constitutional:      Appearance: Normal appearance. He is not ill-appearing.  Cardiovascular:     Rate and Rhythm: Normal rate and regular rhythm.     Pulses: Normal pulses.     Heart sounds: Normal heart sounds.  Pulmonary:     Effort: Pulmonary effort is normal.     Breath sounds: Normal breath sounds.  Abdominal:     General: Abdomen is flat. Bowel sounds are normal. There is no distension.     Palpations: Abdomen is soft. There is no mass.     Tenderness: There is no right CVA tenderness, left CVA tenderness, guarding or rebound.     Hernia: No hernia is present.     Comments: Mildly tender in the lower abdomen   Genitourinary:    Testes: Normal.     Comments: The prostate is mildly enlarged and fluctuant. It is very tender  Neurological:     Mental Status: He is alert.           Assessment & Plan:  Prostatitis, treat with 10 days of Doxycycline. Recheck as needed.  Gershon Crane, MD

## 2023-07-17 ENCOUNTER — Other Ambulatory Visit: Payer: Self-pay | Admitting: Adult Health

## 2023-07-19 ENCOUNTER — Encounter: Payer: Self-pay | Admitting: Adult Health

## 2023-07-20 ENCOUNTER — Telehealth: Payer: Medicare HMO | Admitting: Adult Health

## 2023-07-20 ENCOUNTER — Encounter: Payer: Self-pay | Admitting: Adult Health

## 2023-07-20 VITALS — Ht 70.5 in | Wt 230.0 lb

## 2023-07-20 DIAGNOSIS — J988 Other specified respiratory disorders: Secondary | ICD-10-CM

## 2023-07-20 DIAGNOSIS — R062 Wheezing: Secondary | ICD-10-CM | POA: Diagnosis not present

## 2023-07-20 MED ORDER — AMOXICILLIN 500 MG PO CAPS: 500.0000 mg | ORAL_CAPSULE | Freq: Two times a day (BID) | ORAL | 0 refills | Status: AC

## 2023-07-20 MED ORDER — PREDNISONE 10 MG PO TABS: ORAL_TABLET | ORAL | 0 refills | Status: DC

## 2023-07-20 NOTE — Progress Notes (Signed)
Virtual Visit via Video Note  I connected with Edward Miles on 07/20/23 at 11:30 AM EDT by a video enabled telemedicine application and verified that I am speaking with the correct person using two identifiers.  Location patient: home Location provider:work or home office Persons participating in the virtual visit: patient, provider  I discussed the limitations of evaluation and management by telemedicine and the availability of in person appointments. The patient expressed understanding and agreed to proceed.   HPI: 77 year old male who  has a past medical history of Colon polyps, Diverticulosis, ED (erectile dysfunction), Hypertension, Hypothyroidism, and Obese.  He is being evaluated today for an acute issue.  His symptoms started 2-3 weeks ago. .  Symptoms include nasal congestion with rhinorrhea that is light yellow in color, sinus congestion, wheezing, productive cough with yellow mucus.  His symptoms seem to be worse at night, cough is keeping him from getting a good night sleep.  He denies fevers or chills  About 3 weeks ago he was on a 10-day course of doxycycline for suspected prostatitis, this has resolved.   ROS: See pertinent positives and negatives per HPI.  Past Medical History:  Diagnosis Date   Colon polyps    Diverticulosis    ED (erectile dysfunction)    Hypertension    Hypothyroidism    Obese     Past Surgical History:  Procedure Laterality Date   CATARACT EXTRACTION, BILATERAL Bilateral    CHOLECYSTECTOMY      Family History  Problem Relation Age of Onset   Heart failure Mother    Hypertension Mother    Hypothyroidism Mother    Hodgkin's lymphoma Sister    Hypothyroidism Maternal Grandmother    Colon cancer Neg Hx    Stomach cancer Neg Hx        Current Outpatient Medications:    atenolol (TENORMIN) 25 MG tablet, TAKE 1 TABLET BY MOUTH EVERY DAY, Disp: 90 tablet, Rfl: 3   Azelastine HCl 137 MCG/SPRAY SOLN, SPRAY 2 SPRAYS INTO BOTH NOSTRILS  2 (TWO) TIMES DAILY AS DIRECTED, Disp: 90 mL, Rfl: 1   chlorthalidone (HYGROTON) 25 MG tablet, TAKE 1/2 TABLET BY MOUTH EVERY DAY, Disp: 45 tablet, Rfl: 0   HYDROcodone bit-homatropine (HYCODAN) 5-1.5 MG/5ML syrup, Take 5 mLs by mouth every 8 (eight) hours as needed for cough., Disp: 120 mL, Rfl: 0   levothyroxine (SYNTHROID) 75 MCG tablet, TAKE 1 TABLET BY MOUTH EVERY DAY, Disp: 90 tablet, Rfl: 2   sildenafil (REVATIO) 20 MG tablet, Take 1 to 2 tabs 2 - 3 hours before sex, Disp: 30 tablet, Rfl: 11  EXAM:  VITALS per patient if applicable:  GENERAL: alert, oriented, appears well and in no acute distress  HEENT: atraumatic, conjunttiva clear, no obvious abnormalities on inspection of external nose and ears  NECK: normal movements of the head and neck  LUNGS: on inspection no signs of respiratory distress, breathing rate appears normal, no obvious gross SOB, gasping or wheezing  CV: no obvious cyanosis  MS: moves all visible extremities without noticeable abnormality  PSYCH/NEURO: pleasant and cooperative, no obvious depression or anxiety, speech and thought processing grossly intact  ASSESSMENT AND PLAN:  Discussed the following assessment and plan:  1. Respiratory infection -Send in amoxicillin for suspected sinusitis that has been going on for the last few weeks.  Also provide a course of prednisone due to wheezing.  Advise follow-up if no improvement over the next week - He can continue with Flonase - amoxicillin (AMOXIL)  500 MG capsule; Take 1 capsule (500 mg total) by mouth 2 (two) times daily for 7 days.  Dispense: 14 capsule; Refill: 0  2. Wheezing  - predniSONE (DELTASONE) 10 MG tablet; 40 mg x 3 days, 20 mg x 3 days, 10 mg x 3 days  Dispense: 21 tablet; Refill: 0      I discussed the assessment and treatment plan with the patient. The patient was provided an opportunity to ask questions and all were answered. The patient agreed with the plan and demonstrated an  understanding of the instructions.   The patient was advised to call back or seek an in-person evaluation if the symptoms worsen or if the condition fails to improve as anticipated.   Shirline Frees, NP

## 2023-07-20 NOTE — Telephone Encounter (Signed)
Pt has been set up for VV.

## 2023-08-10 ENCOUNTER — Ambulatory Visit (INDEPENDENT_AMBULATORY_CARE_PROVIDER_SITE_OTHER): Payer: Medicare HMO | Admitting: Adult Health

## 2023-08-10 ENCOUNTER — Encounter: Payer: Self-pay | Admitting: Adult Health

## 2023-08-10 VITALS — BP 120/82 | HR 53 | Temp 98.0°F | Ht 70.5 in | Wt 233.0 lb

## 2023-08-10 DIAGNOSIS — R350 Frequency of micturition: Secondary | ICD-10-CM | POA: Diagnosis not present

## 2023-08-10 DIAGNOSIS — R3914 Feeling of incomplete bladder emptying: Secondary | ICD-10-CM

## 2023-08-10 DIAGNOSIS — N401 Enlarged prostate with lower urinary tract symptoms: Secondary | ICD-10-CM | POA: Diagnosis not present

## 2023-08-10 LAB — POCT URINALYSIS DIPSTICK
Bilirubin, UA: NEGATIVE
Blood, UA: NEGATIVE
Glucose, UA: NEGATIVE
Ketones, UA: NEGATIVE
Leukocytes, UA: NEGATIVE
Nitrite, UA: NEGATIVE
Protein, UA: NEGATIVE
Spec Grav, UA: 1.025 (ref 1.010–1.025)
Urobilinogen, UA: 0.2 U/dL
pH, UA: 6 (ref 5.0–8.0)

## 2023-08-10 LAB — PSA: PSA: 2.85 ng/mL (ref 0.10–4.00)

## 2023-08-10 MED ORDER — TAMSULOSIN HCL 0.4 MG PO CAPS: 0.4000 mg | ORAL_CAPSULE | Freq: Every day | ORAL | 1 refills | Status: DC

## 2023-08-10 NOTE — Addendum Note (Signed)
Addended by: Donald Pore A on: 08/10/2023 11:42 AM   Modules accepted: Orders

## 2023-08-10 NOTE — Progress Notes (Signed)
Subjective:    Patient ID: Edward Miles, male    DOB: 1946-11-23, 77 y.o.   MRN: [id number]  Urinary Frequency  Associated symptoms include frequency.   77 year old male who  has a past medical history of Colon polyps, Diverticulosis, ED (erectile dysfunction), Hypertension, Hypothyroidism, and Obese.  He presents to the office today for an acute issue. He is concerned he may have  a UTI. He reports that over the last two days he has been experiencing the sensation of bladder fullness, decreased stream, frequent urination and uncomfortable feeling when urinating"   He denies low back pain, hematuria, or dysuria. He has not had any fevers or chills.   He had prostatitis about a month ago and reports " it feels different". He denies pain with bowel movements, when sitting, or perineal pain.    Review of Systems  Genitourinary:  Positive for frequency.   See HPI   Past Medical History:  Diagnosis Date   Colon polyps    Diverticulosis    ED (erectile dysfunction)    Hypertension    Hypothyroidism    Obese     Social History   Socioeconomic History   Marital status: Married    Spouse name: Not on file   Number of children: Not on file   Years of education: Not on file   Highest education level: Master's degree (e.g., MA, MS, MEng, MEd, MSW, MBA)  Occupational History   Not on file  Tobacco Use   Smoking status: Never    Passive exposure: Never   Smokeless tobacco: Never  Vaping Use   Vaping status: Never Used  Substance and Sexual Activity   Alcohol use: No   Drug use: No   Sexual activity: Not on file  Other Topics Concern   Not on file  Social History Narrative   Geologist, engineering    Married    Social Determinants of Health   Financial Resource Strain: Low Risk  (08/09/2023)   Overall Financial Resource Strain (CARDIA)    Difficulty of Paying Living Expenses: Not hard at all  Food Insecurity: No Food Insecurity (08/09/2023)   Hunger Vital Sign     Worried About Running Out of Food in the Last Year: Never true    Ran Out of Food in the Last Year: Never true  Transportation Needs: No Transportation Needs (08/09/2023)   PRAPARE - Administrator, Civil Service (Medical): No    Lack of Transportation (Non-Medical): No  Physical Activity: Insufficiently Active (08/09/2023)   Exercise Vital Sign    Days of Exercise per Week: 3 days    Minutes of Exercise per Session: 30 min  Stress: No Stress Concern Present (08/09/2023)   [employer] of Occupational Health - Occupational Stress Questionnaire    Feeling of Stress : Not at all  Social Connections: Unknown (08/09/2023)   Social Connection and Isolation Panel [NHANES]    Frequency of Communication with Friends and Family: More than three times a week    Frequency of Social Gatherings with Friends and Family: Once a week    Attends Religious Services: Patient declined    Database administrator or Organizations: Yes    Attends Engineer, structural: More than 4 times per year    Marital Status: Married  Catering manager Violence: Not At Risk (01/26/2023)   Humiliation, Afraid, Rape, and Kick questionnaire    Fear of Current or Ex-Partner: No    Emotionally Abused:  No    Physically Abused: No    Sexually Abused: No    Past Surgical History:  Procedure Laterality Date   CATARACT EXTRACTION, BILATERAL Bilateral    CHOLECYSTECTOMY      Family History  Problem Relation Age of Onset   Heart failure Mother    Hypertension Mother    Hypothyroidism Mother    Hodgkin's lymphoma Sister    Hypothyroidism Maternal Grandmother    Colon cancer Neg Hx    Stomach cancer Neg Hx     No Known Allergies  Current Outpatient Medications on File Prior to Visit  Medication Sig Dispense Refill   atenolol (TENORMIN) 25 MG tablet TAKE 1 TABLET BY MOUTH EVERY DAY 90 tablet 3   Azelastine HCl 137 MCG/SPRAY SOLN SPRAY 2 SPRAYS INTO BOTH NOSTRILS 2 (TWO) TIMES DAILY AS DIRECTED 90 mL 1    chlorthalidone (HYGROTON) 25 MG tablet TAKE 1/2 TABLET BY MOUTH EVERY DAY 45 tablet 0   HYDROcodone bit-homatropine (HYCODAN) 5-1.5 MG/5ML syrup Take 5 mLs by mouth every 8 (eight) hours as needed for cough. 120 mL 0   levothyroxine (SYNTHROID) 75 MCG tablet TAKE 1 TABLET BY MOUTH EVERY DAY 90 tablet 2   predniSONE (DELTASONE) 10 MG tablet 40 mg x 3 days, 20 mg x 3 days, 10 mg x 3 days 21 tablet 0   sildenafil (REVATIO) 20 MG tablet Take 1 to 2 tabs 2 - 3 hours before sex 30 tablet 11   No current facility-administered medications on file prior to visit.    BP 120/82   Pulse (!) 53   Temp 98 F (36.7 C) (Oral)   Ht 5' 10.5" (1.791 m)   Wt 233 lb (105.7 kg)   SpO2 97%   BMI 32.96 kg/m       Objective:   Physical Exam Vitals and nursing note reviewed.  Constitutional:      Appearance: Normal appearance.  Cardiovascular:     Rate and Rhythm: Normal rate and regular rhythm.     Pulses: Normal pulses.     Heart sounds: Normal heart sounds.  Pulmonary:     Effort: Pulmonary effort is normal.     Breath sounds: Normal breath sounds.  Abdominal:     General: Abdomen is flat.     Palpations: Abdomen is soft.  Musculoskeletal:        General: Normal range of motion.  Skin:    General: Skin is warm and dry.  Neurological:     General: No focal deficit present.     Mental Status: He is alert and oriented to person, place, and time.  Psychiatric:        Mood and Affect: Mood normal.        Behavior: Behavior normal.        Thought Content: Thought content normal.        Judgment: Judgment normal.       Assessment & Plan:  1. Benign prostatic hyperplasia with incomplete bladder emptying - Appears to be BPH. Will check PSA and CBC today. Start on Flomax - Consider referral to urology  - PSA; Future - CBC with Differential/Platelet; Future - tamsulosin (FLOMAX) 0.4 MG CAPS capsule; Take 1 capsule (0.4 mg total) by mouth daily.  Dispense: 90 capsule; Refill: 1  2.  Frequent urination  - POC Urinalysis Dipstick- negative   Shirline Frees, NP

## 2023-08-12 ENCOUNTER — Encounter: Payer: Self-pay | Admitting: Adult Health

## 2023-08-16 NOTE — Telephone Encounter (Signed)
Please advise 

## 2023-08-18 ENCOUNTER — Other Ambulatory Visit: Payer: Self-pay | Admitting: Adult Health

## 2023-08-18 DIAGNOSIS — Z9109 Other allergy status, other than to drugs and biological substances: Secondary | ICD-10-CM

## 2023-08-30 DIAGNOSIS — J3089 Other allergic rhinitis: Secondary | ICD-10-CM | POA: Diagnosis not present

## 2023-08-30 DIAGNOSIS — J3081 Allergic rhinitis due to animal (cat) (dog) hair and dander: Secondary | ICD-10-CM | POA: Diagnosis not present

## 2023-08-30 DIAGNOSIS — J709 Respiratory conditions due to unspecified external agent: Secondary | ICD-10-CM | POA: Diagnosis not present

## 2023-08-30 DIAGNOSIS — R052 Subacute cough: Secondary | ICD-10-CM | POA: Diagnosis not present

## 2023-08-30 DIAGNOSIS — J301 Allergic rhinitis due to pollen: Secondary | ICD-10-CM | POA: Diagnosis not present

## 2023-09-01 ENCOUNTER — Ambulatory Visit
Admission: RE | Admit: 2023-09-01 | Discharge: 2023-09-01 | Disposition: A | Discharge status: Home / Self Care | Payer: Medicare HMO | Source: Ambulatory Visit | Attending: Allergy | Admitting: Allergy

## 2023-09-01 ENCOUNTER — Other Ambulatory Visit: Payer: Self-pay | Admitting: Allergy

## 2023-09-01 DIAGNOSIS — R051 Acute cough: Secondary | ICD-10-CM | POA: Diagnosis not present

## 2023-09-01 DIAGNOSIS — R052 Subacute cough: Secondary | ICD-10-CM

## 2023-09-09 DIAGNOSIS — J3089 Other allergic rhinitis: Secondary | ICD-10-CM | POA: Diagnosis not present

## 2023-09-09 DIAGNOSIS — J301 Allergic rhinitis due to pollen: Secondary | ICD-10-CM | POA: Diagnosis not present

## 2023-09-09 DIAGNOSIS — J3081 Allergic rhinitis due to animal (cat) (dog) hair and dander: Secondary | ICD-10-CM | POA: Diagnosis not present

## 2023-09-19 DIAGNOSIS — J301 Allergic rhinitis due to pollen: Secondary | ICD-10-CM | POA: Diagnosis not present

## 2023-09-19 DIAGNOSIS — J3081 Allergic rhinitis due to animal (cat) (dog) hair and dander: Secondary | ICD-10-CM | POA: Diagnosis not present

## 2023-09-19 DIAGNOSIS — J3089 Other allergic rhinitis: Secondary | ICD-10-CM | POA: Diagnosis not present

## 2023-09-21 DIAGNOSIS — J301 Allergic rhinitis due to pollen: Secondary | ICD-10-CM | POA: Diagnosis not present

## 2023-09-21 DIAGNOSIS — J3081 Allergic rhinitis due to animal (cat) (dog) hair and dander: Secondary | ICD-10-CM | POA: Diagnosis not present

## 2023-09-21 DIAGNOSIS — J3089 Other allergic rhinitis: Secondary | ICD-10-CM | POA: Diagnosis not present

## 2023-09-23 DIAGNOSIS — J301 Allergic rhinitis due to pollen: Secondary | ICD-10-CM | POA: Diagnosis not present

## 2023-09-23 DIAGNOSIS — J3081 Allergic rhinitis due to animal (cat) (dog) hair and dander: Secondary | ICD-10-CM | POA: Diagnosis not present

## 2023-09-23 DIAGNOSIS — J3089 Other allergic rhinitis: Secondary | ICD-10-CM | POA: Diagnosis not present

## 2023-09-26 DIAGNOSIS — J3089 Other allergic rhinitis: Secondary | ICD-10-CM | POA: Diagnosis not present

## 2023-09-26 DIAGNOSIS — J3081 Allergic rhinitis due to animal (cat) (dog) hair and dander: Secondary | ICD-10-CM | POA: Diagnosis not present

## 2023-09-26 DIAGNOSIS — J301 Allergic rhinitis due to pollen: Secondary | ICD-10-CM | POA: Diagnosis not present

## 2023-09-30 DIAGNOSIS — J3089 Other allergic rhinitis: Secondary | ICD-10-CM | POA: Diagnosis not present

## 2023-09-30 DIAGNOSIS — J301 Allergic rhinitis due to pollen: Secondary | ICD-10-CM | POA: Diagnosis not present

## 2023-09-30 DIAGNOSIS — J3081 Allergic rhinitis due to animal (cat) (dog) hair and dander: Secondary | ICD-10-CM | POA: Diagnosis not present

## 2023-10-03 DIAGNOSIS — J3089 Other allergic rhinitis: Secondary | ICD-10-CM | POA: Diagnosis not present

## 2023-10-03 DIAGNOSIS — J3081 Allergic rhinitis due to animal (cat) (dog) hair and dander: Secondary | ICD-10-CM | POA: Diagnosis not present

## 2023-10-03 DIAGNOSIS — J301 Allergic rhinitis due to pollen: Secondary | ICD-10-CM | POA: Diagnosis not present

## 2023-10-06 DIAGNOSIS — J301 Allergic rhinitis due to pollen: Secondary | ICD-10-CM | POA: Diagnosis not present

## 2023-10-06 DIAGNOSIS — J3081 Allergic rhinitis due to animal (cat) (dog) hair and dander: Secondary | ICD-10-CM | POA: Diagnosis not present

## 2023-10-06 DIAGNOSIS — J3089 Other allergic rhinitis: Secondary | ICD-10-CM | POA: Diagnosis not present

## 2023-10-10 DIAGNOSIS — J3081 Allergic rhinitis due to animal (cat) (dog) hair and dander: Secondary | ICD-10-CM | POA: Diagnosis not present

## 2023-10-10 DIAGNOSIS — J301 Allergic rhinitis due to pollen: Secondary | ICD-10-CM | POA: Diagnosis not present

## 2023-10-10 DIAGNOSIS — J3089 Other allergic rhinitis: Secondary | ICD-10-CM | POA: Diagnosis not present

## 2023-10-12 DIAGNOSIS — J3089 Other allergic rhinitis: Secondary | ICD-10-CM | POA: Diagnosis not present

## 2023-10-12 DIAGNOSIS — J3081 Allergic rhinitis due to animal (cat) (dog) hair and dander: Secondary | ICD-10-CM | POA: Diagnosis not present

## 2023-10-12 DIAGNOSIS — J301 Allergic rhinitis due to pollen: Secondary | ICD-10-CM | POA: Diagnosis not present

## 2023-10-14 DIAGNOSIS — J3089 Other allergic rhinitis: Secondary | ICD-10-CM | POA: Diagnosis not present

## 2023-10-14 DIAGNOSIS — J301 Allergic rhinitis due to pollen: Secondary | ICD-10-CM | POA: Diagnosis not present

## 2023-10-14 DIAGNOSIS — J3081 Allergic rhinitis due to animal (cat) (dog) hair and dander: Secondary | ICD-10-CM | POA: Diagnosis not present

## 2023-10-18 DIAGNOSIS — J3081 Allergic rhinitis due to animal (cat) (dog) hair and dander: Secondary | ICD-10-CM | POA: Diagnosis not present

## 2023-10-18 DIAGNOSIS — J3089 Other allergic rhinitis: Secondary | ICD-10-CM | POA: Diagnosis not present

## 2023-10-18 DIAGNOSIS — J301 Allergic rhinitis due to pollen: Secondary | ICD-10-CM | POA: Diagnosis not present

## 2023-10-21 DIAGNOSIS — J3081 Allergic rhinitis due to animal (cat) (dog) hair and dander: Secondary | ICD-10-CM | POA: Diagnosis not present

## 2023-10-21 DIAGNOSIS — J3089 Other allergic rhinitis: Secondary | ICD-10-CM | POA: Diagnosis not present

## 2023-10-21 DIAGNOSIS — J301 Allergic rhinitis due to pollen: Secondary | ICD-10-CM | POA: Diagnosis not present

## 2023-10-24 DIAGNOSIS — J301 Allergic rhinitis due to pollen: Secondary | ICD-10-CM | POA: Diagnosis not present

## 2023-10-24 DIAGNOSIS — J3081 Allergic rhinitis due to animal (cat) (dog) hair and dander: Secondary | ICD-10-CM | POA: Diagnosis not present

## 2023-10-24 DIAGNOSIS — J3089 Other allergic rhinitis: Secondary | ICD-10-CM | POA: Diagnosis not present

## 2023-10-27 DIAGNOSIS — J301 Allergic rhinitis due to pollen: Secondary | ICD-10-CM | POA: Diagnosis not present

## 2023-10-27 DIAGNOSIS — J3081 Allergic rhinitis due to animal (cat) (dog) hair and dander: Secondary | ICD-10-CM | POA: Diagnosis not present

## 2023-10-27 DIAGNOSIS — J3089 Other allergic rhinitis: Secondary | ICD-10-CM | POA: Diagnosis not present

## 2023-10-31 DIAGNOSIS — J3089 Other allergic rhinitis: Secondary | ICD-10-CM | POA: Diagnosis not present

## 2023-10-31 DIAGNOSIS — J301 Allergic rhinitis due to pollen: Secondary | ICD-10-CM | POA: Diagnosis not present

## 2023-10-31 DIAGNOSIS — J3081 Allergic rhinitis due to animal (cat) (dog) hair and dander: Secondary | ICD-10-CM | POA: Diagnosis not present

## 2023-11-07 DIAGNOSIS — J3089 Other allergic rhinitis: Secondary | ICD-10-CM | POA: Diagnosis not present

## 2023-11-07 DIAGNOSIS — J301 Allergic rhinitis due to pollen: Secondary | ICD-10-CM | POA: Diagnosis not present

## 2023-11-07 DIAGNOSIS — J3081 Allergic rhinitis due to animal (cat) (dog) hair and dander: Secondary | ICD-10-CM | POA: Diagnosis not present

## 2023-11-11 DIAGNOSIS — J3081 Allergic rhinitis due to animal (cat) (dog) hair and dander: Secondary | ICD-10-CM | POA: Diagnosis not present

## 2023-11-11 DIAGNOSIS — J301 Allergic rhinitis due to pollen: Secondary | ICD-10-CM | POA: Diagnosis not present

## 2023-11-11 DIAGNOSIS — J3089 Other allergic rhinitis: Secondary | ICD-10-CM | POA: Diagnosis not present

## 2023-11-14 DIAGNOSIS — J3089 Other allergic rhinitis: Secondary | ICD-10-CM | POA: Diagnosis not present

## 2023-11-14 DIAGNOSIS — J301 Allergic rhinitis due to pollen: Secondary | ICD-10-CM | POA: Diagnosis not present

## 2023-11-14 DIAGNOSIS — J3081 Allergic rhinitis due to animal (cat) (dog) hair and dander: Secondary | ICD-10-CM | POA: Diagnosis not present

## 2023-11-21 DIAGNOSIS — J301 Allergic rhinitis due to pollen: Secondary | ICD-10-CM | POA: Diagnosis not present

## 2023-11-21 DIAGNOSIS — J3089 Other allergic rhinitis: Secondary | ICD-10-CM | POA: Diagnosis not present

## 2023-11-21 DIAGNOSIS — J3081 Allergic rhinitis due to animal (cat) (dog) hair and dander: Secondary | ICD-10-CM | POA: Diagnosis not present

## 2023-11-28 DIAGNOSIS — J3081 Allergic rhinitis due to animal (cat) (dog) hair and dander: Secondary | ICD-10-CM | POA: Diagnosis not present

## 2023-11-28 DIAGNOSIS — J301 Allergic rhinitis due to pollen: Secondary | ICD-10-CM | POA: Diagnosis not present

## 2023-11-28 DIAGNOSIS — J3089 Other allergic rhinitis: Secondary | ICD-10-CM | POA: Diagnosis not present

## 2023-12-05 DIAGNOSIS — J3089 Other allergic rhinitis: Secondary | ICD-10-CM | POA: Diagnosis not present

## 2023-12-05 DIAGNOSIS — J3081 Allergic rhinitis due to animal (cat) (dog) hair and dander: Secondary | ICD-10-CM | POA: Diagnosis not present

## 2023-12-05 DIAGNOSIS — J301 Allergic rhinitis due to pollen: Secondary | ICD-10-CM | POA: Diagnosis not present

## 2023-12-13 DIAGNOSIS — J3089 Other allergic rhinitis: Secondary | ICD-10-CM | POA: Diagnosis not present

## 2023-12-13 DIAGNOSIS — J3081 Allergic rhinitis due to animal (cat) (dog) hair and dander: Secondary | ICD-10-CM | POA: Diagnosis not present

## 2023-12-13 DIAGNOSIS — J301 Allergic rhinitis due to pollen: Secondary | ICD-10-CM | POA: Diagnosis not present

## 2023-12-19 ENCOUNTER — Encounter: Payer: Self-pay | Admitting: Adult Health

## 2023-12-19 DIAGNOSIS — J3089 Other allergic rhinitis: Secondary | ICD-10-CM | POA: Diagnosis not present

## 2023-12-19 DIAGNOSIS — J3081 Allergic rhinitis due to animal (cat) (dog) hair and dander: Secondary | ICD-10-CM | POA: Diagnosis not present

## 2023-12-19 DIAGNOSIS — J301 Allergic rhinitis due to pollen: Secondary | ICD-10-CM | POA: Diagnosis not present

## 2023-12-20 ENCOUNTER — Ambulatory Visit (INDEPENDENT_AMBULATORY_CARE_PROVIDER_SITE_OTHER): Payer: Medicare HMO | Admitting: Adult Health

## 2023-12-20 ENCOUNTER — Encounter: Payer: Self-pay | Admitting: Adult Health

## 2023-12-20 VITALS — BP 120/80 | HR 58 | Temp 97.7°F | Ht 70.15 in | Wt 241.0 lb

## 2023-12-20 DIAGNOSIS — J329 Chronic sinusitis, unspecified: Secondary | ICD-10-CM | POA: Diagnosis not present

## 2023-12-20 MED ORDER — AMOXICILLIN-POT CLAVULANATE 875-125 MG PO TABS: 1.0000 | ORAL_TABLET | Freq: Two times a day (BID) | ORAL | 0 refills | Status: DC

## 2023-12-20 NOTE — Progress Notes (Signed)
 Subjective:    Patient ID: Edward Miles, male    DOB: 19-Aug-1946, 78 y.o.   MRN: [id number]  Sinus Problem This is a recurrent problem. The current episode started 1 to 4 weeks ago. The problem is unchanged. There has been no fever. Associated symptoms include congestion, headaches and sinus pressure. Pertinent negatives include no ear pain, hoarse voice, neck pain, shortness of breath, sore throat or swollen glands. Past treatments include oral decongestants and acetaminophen. The treatment provided mild relief.  -    Review of Systems  HENT:  Positive for congestion and sinus pressure. Negative for ear pain, hoarse voice and sore throat.   Respiratory:  Negative for shortness of breath.   Musculoskeletal:  Negative for neck pain.  Neurological:  Positive for headaches.   Past Medical History:  Diagnosis Date   Colon polyps    Diverticulosis    ED (erectile dysfunction)    Hypertension    Hypothyroidism    Obese     Social History   Socioeconomic History   Marital status: Married    Spouse name: Not on file   Number of children: Not on file   Years of education: Not on file   Highest education level: Master's degree (e.g., MA, MS, MEng, MEd, MSW, MBA)  Occupational History   Not on file  Tobacco Use   Smoking status: Never    Passive exposure: Never   Smokeless tobacco: Never  Vaping Use   Vaping status: Never Used  Substance and Sexual Activity   Alcohol use: No   Drug use: No   Sexual activity: Not on file  Other Topics Concern   Not on file  Social History Narrative   Geologist, Engineering    Married    Social Drivers of Health   Financial Resource Strain: Low Risk  (08/09/2023)   Overall Financial Resource Strain (CARDIA)    Difficulty of Paying Living Expenses: Not hard at all  Food Insecurity: No Food Insecurity (08/09/2023)   Hunger Vital Sign    Worried About Running Out of Food in the Last Year: Never true    Ran Out of Food in the Last  Year: Never true  Transportation Needs: No Transportation Needs (08/09/2023)   PRAPARE - Administrator, Civil Service (Medical): No    Lack of Transportation (Non-Medical): No  Physical Activity: Insufficiently Active (08/09/2023)   Exercise Vital Sign    Days of Exercise per Week: 3 days    Minutes of Exercise per Session: 30 min  Stress: No Stress Concern Present (08/09/2023)   [employer] of Occupational Health - Occupational Stress Questionnaire    Feeling of Stress : Not at all  Social Connections: Unknown (08/09/2023)   Social Connection and Isolation Panel [NHANES]    Frequency of Communication with Friends and Family: More than three times a week    Frequency of Social Gatherings with Friends and Family: Once a week    Attends Religious Services: Patient declined    Database Administrator or Organizations: Yes    Attends Engineer, Structural: More than 4 times per year    Marital Status: Married  Catering Manager Violence: Not At Risk (01/26/2023)   Humiliation, Afraid, Rape, and Kick questionnaire    Fear of Current or Ex-Partner: No    Emotionally Abused: No    Physically Abused: No    Sexually Abused: No    Past Surgical History:  Procedure Laterality Date  CATARACT EXTRACTION, BILATERAL Bilateral    CHOLECYSTECTOMY      Family History  Problem Relation Age of Onset   Heart failure Mother    Hypertension Mother    Hypothyroidism Mother    Hodgkin's lymphoma Sister    Hypothyroidism Maternal Grandmother    Colon cancer Neg Hx    Stomach cancer Neg Hx     No Known Allergies  Current Outpatient Medications on File Prior to Visit  Medication Sig Dispense Refill   atenolol  (TENORMIN ) 25 MG tablet TAKE 1 TABLET BY MOUTH EVERY DAY 90 tablet 3   Azelastine  HCl 137 MCG/SPRAY SOLN SPRAY 2 SPRAYS INTO BOTH NOSTRILS 2 (TWO) TIMES DAILY AS DIRECTED 90 mL 1   chlorthalidone  (HYGROTON ) 25 MG tablet TAKE 1/2 TABLET BY MOUTH EVERY DAY 45 tablet 0    levothyroxine  (SYNTHROID ) 75 MCG tablet TAKE 1 TABLET BY MOUTH EVERY DAY 90 tablet 2   sildenafil  (REVATIO ) 20 MG tablet Take 1 to 2 tabs 2 - 3 hours before sex 30 tablet 11   tamsulosin  (FLOMAX ) 0.4 MG CAPS capsule Take 1 capsule (0.4 mg total) by mouth daily. 90 capsule 1   No current facility-administered medications on file prior to visit.    BP 120/80   Pulse (!) 58   Temp 97.7 F (36.5 C) (Oral)   Ht 5' 10.15 (1.782 m)   Wt 241 lb (109.3 kg)   SpO2 99%   BMI 34.43 kg/m       Objective:   Physical Exam Vitals reviewed.  Constitutional:      Appearance: He is well-developed.  HENT:     Nose: Congestion and rhinorrhea present.     Right Turbinates: Not enlarged or swollen.     Left Turbinates: Enlarged and swollen.     Left Sinus: Maxillary sinus tenderness and frontal sinus tenderness present.  Cardiovascular:     Rate and Rhythm: Normal rate and regular rhythm.     Pulses: Normal pulses.     Heart sounds: Normal heart sounds.  Pulmonary:     Effort: Pulmonary effort is normal.     Breath sounds: Normal breath sounds.  Skin:    General: Skin is warm and dry.  Neurological:     General: No focal deficit present.     Mental Status: He is alert and oriented to person, place, and time.  Psychiatric:        Mood and Affect: Mood normal.        Behavior: Behavior normal.        Thought Content: Thought content normal.        Judgment: Judgment normal.        Assessment & Plan:  1. Recurrent sinusitis (Primary) - Symptoms consistent with sinusitis. His sinus infections present on the left side of his face. I am going to order a CT scan to look for structural abnormality. Will treat with Augmentin   - CT SINUS WO CONTRAST; Future - amoxicillin -clavulanate (AUGMENTIN ) 875-125 MG tablet; Take 1 tablet by mouth 2 (two) times daily.  Dispense: 20 tablet; Refill: 0  Darleene Shape, NP

## 2023-12-28 DIAGNOSIS — J3081 Allergic rhinitis due to animal (cat) (dog) hair and dander: Secondary | ICD-10-CM | POA: Diagnosis not present

## 2023-12-28 DIAGNOSIS — J3089 Other allergic rhinitis: Secondary | ICD-10-CM | POA: Diagnosis not present

## 2023-12-28 DIAGNOSIS — J301 Allergic rhinitis due to pollen: Secondary | ICD-10-CM | POA: Diagnosis not present

## 2023-12-29 DIAGNOSIS — J301 Allergic rhinitis due to pollen: Secondary | ICD-10-CM | POA: Diagnosis not present

## 2024-01-02 DIAGNOSIS — J3081 Allergic rhinitis due to animal (cat) (dog) hair and dander: Secondary | ICD-10-CM | POA: Diagnosis not present

## 2024-01-02 DIAGNOSIS — J3089 Other allergic rhinitis: Secondary | ICD-10-CM | POA: Diagnosis not present

## 2024-01-02 DIAGNOSIS — J301 Allergic rhinitis due to pollen: Secondary | ICD-10-CM | POA: Diagnosis not present

## 2024-01-08 ENCOUNTER — Other Ambulatory Visit: Payer: Self-pay | Admitting: Adult Health

## 2024-01-09 ENCOUNTER — Ambulatory Visit
Admission: RE | Admit: 2024-01-09 | Discharge: 2024-01-09 | Disposition: A | Discharge status: Home / Self Care | Payer: Medicare HMO | Source: Ambulatory Visit | Attending: Adult Health | Admitting: Adult Health

## 2024-01-09 ENCOUNTER — Other Ambulatory Visit: Payer: Self-pay | Admitting: Adult Health

## 2024-01-09 DIAGNOSIS — J329 Chronic sinusitis, unspecified: Secondary | ICD-10-CM

## 2024-01-09 DIAGNOSIS — N401 Enlarged prostate with lower urinary tract symptoms: Secondary | ICD-10-CM

## 2024-01-09 DIAGNOSIS — J3489 Other specified disorders of nose and nasal sinuses: Secondary | ICD-10-CM | POA: Diagnosis not present

## 2024-01-13 ENCOUNTER — Encounter: Payer: Self-pay | Admitting: Adult Health

## 2024-01-26 ENCOUNTER — Other Ambulatory Visit: Payer: Self-pay | Admitting: Adult Health

## 2024-01-26 ENCOUNTER — Encounter: Payer: Self-pay | Admitting: Adult Health

## 2024-01-26 DIAGNOSIS — J323 Chronic sphenoidal sinusitis: Secondary | ICD-10-CM

## 2024-01-30 DIAGNOSIS — J3089 Other allergic rhinitis: Secondary | ICD-10-CM | POA: Diagnosis not present

## 2024-01-30 DIAGNOSIS — J301 Allergic rhinitis due to pollen: Secondary | ICD-10-CM | POA: Diagnosis not present

## 2024-01-30 DIAGNOSIS — J3081 Allergic rhinitis due to animal (cat) (dog) hair and dander: Secondary | ICD-10-CM | POA: Diagnosis not present

## 2024-01-31 ENCOUNTER — Encounter: Payer: Self-pay | Admitting: Adult Health

## 2024-01-31 ENCOUNTER — Encounter (INDEPENDENT_AMBULATORY_CARE_PROVIDER_SITE_OTHER): Payer: Self-pay | Admitting: Otolaryngology

## 2024-01-31 ENCOUNTER — Ambulatory Visit (INDEPENDENT_AMBULATORY_CARE_PROVIDER_SITE_OTHER): Payer: Medicare HMO | Admitting: Adult Health

## 2024-01-31 VITALS — BP 126/70 | HR 57 | Temp 98.1°F | Ht 70.5 in | Wt 239.0 lb

## 2024-01-31 DIAGNOSIS — N529 Male erectile dysfunction, unspecified: Secondary | ICD-10-CM | POA: Diagnosis not present

## 2024-01-31 DIAGNOSIS — N400 Enlarged prostate without lower urinary tract symptoms: Secondary | ICD-10-CM

## 2024-01-31 DIAGNOSIS — E039 Hypothyroidism, unspecified: Secondary | ICD-10-CM | POA: Diagnosis not present

## 2024-01-31 DIAGNOSIS — E782 Mixed hyperlipidemia: Secondary | ICD-10-CM

## 2024-01-31 DIAGNOSIS — I10 Essential (primary) hypertension: Secondary | ICD-10-CM

## 2024-01-31 DIAGNOSIS — Z0000 Encounter for general adult medical examination without abnormal findings: Secondary | ICD-10-CM | POA: Diagnosis not present

## 2024-01-31 MED ORDER — SILDENAFIL CITRATE 100 MG PO TABS
100.0000 mg | ORAL_TABLET | Freq: Every day | ORAL | 1 refills | Status: AC | PRN
Start: 2024-01-31 — End: ?

## 2024-01-31 NOTE — Progress Notes (Signed)
 Subjective:    Patient ID: Edward Miles, male    DOB: [birth year], 78 y.o.   MRN: [id number]  HPI Patient presents for yearly preventative medicine examination. He is a pleasant 78 year old male who  has a past medical history of Colon polyps, Diverticulosis, ED (erectile dysfunction), Hypertension, Hypothyroidism, and Obese.  Essential hypertension-managed with atenolol 25 mg daily and chlorthalidone 12.5 mg daily.  He denies dizziness, lightheadedness, chest pain, or shortness of breath  BP Readings from Last 3 Encounters:  01/31/24 126/70  12/20/23 120/80  08/10/23 120/82   Hypothyroidism-has been well controlled in the past with Synthroid 75 mcg daily Lab Results  Component Value Date   TSH 4.63 09/24/2022   Erectile dysfunction -uses Viagra as needed  Hyperlipidemia - not currently on medication  Lab Results  Component Value Date   CHOL 172 09/24/2022   HDL 54.10 09/24/2022   LDLCALC 104 (H) 09/24/2022   TRIG 72.0 09/24/2022   CHOLHDL 3 09/24/2022   BPH - denies issues.   All immunizations and health maintenance protocols were reviewed with the patient and needed orders were placed.  Appropriate screening laboratory values were ordered for the patient including screening of hyperlipidemia, renal function and hepatic function. If indicated by BPH, a PSA was ordered.  Medication reconciliation,  past medical history, social history, problem list and allergies were reviewed in detail with the patient  Goals were established with regard to weight loss, exercise, and  diet in compliance with medications  Wt Readings from Last 3 Encounters:  01/31/24 239 lb (108.4 kg)  12/20/23 241 lb (109.3 kg)  08/10/23 233 lb (105.7 kg)   Review of Systems  Constitutional: Negative.   HENT: Negative.    Eyes: Negative.   Respiratory: Negative.    Cardiovascular: Negative.   Gastrointestinal: Negative.   Endocrine: Negative.   Genitourinary: Negative.   Musculoskeletal:  Negative.   Skin: Negative.   Allergic/Immunologic: Negative.   Neurological: Negative.   Hematological: Negative.   Psychiatric/Behavioral: Negative.    All other systems reviewed and are negative.  Past Medical History:  Diagnosis Date   Colon polyps    Diverticulosis    ED (erectile dysfunction)    Hypertension    Hypothyroidism    Obese     Social History   Socioeconomic History   Marital status: Married    Spouse name: Not on file   Number of children: Not on file   Years of education: Not on file   Highest education level: Master's degree (e.g., MA, MS, MEng, MEd, MSW, MBA)  Occupational History   Not on file  Tobacco Use   Smoking status: Never    Passive exposure: Never   Smokeless tobacco: Never  Vaping Use   Vaping status: Never Used  Substance and Sexual Activity   Alcohol use: No   Drug use: No   Sexual activity: Not on file  Other Topics Concern   Not on file  Social History Narrative   Geologist, engineering    Married    Social Drivers of Health   Financial Resource Strain: Low Risk  (08/09/2023)   Overall Financial Resource Strain (CARDIA)    Difficulty of Paying Living Expenses: Not hard at all  Food Insecurity: No Food Insecurity (08/09/2023)   Hunger Vital Sign    Worried About Running Out of Food in the Last Year: Never true    Ran Out of Food in the Last Year: Never true  Transportation Needs: No  Transportation Needs (08/09/2023)   PRAPARE - Administrator, Civil Service (Medical): No    Lack of Transportation (Non-Medical): No  Physical Activity: Insufficiently Active (08/09/2023)   Exercise Vital Sign    Days of Exercise per Week: 3 days    Minutes of Exercise per Session: 30 min  Stress: No Stress Concern Present (08/09/2023)   [employer] of Occupational Health - Occupational Stress Questionnaire    Feeling of Stress : Not at all  Social Connections: Unknown (08/09/2023)   Social Connection and Isolation Panel [NHANES]     Frequency of Communication with Friends and Family: More than three times a week    Frequency of Social Gatherings with Friends and Family: Once a week    Attends Religious Services: Patient declined    Database administrator or Organizations: Yes    Attends Engineer, structural: More than 4 times per year    Marital Status: Married  Catering manager Violence: Not At Risk (01/26/2023)   Humiliation, Afraid, Rape, and Kick questionnaire    Fear of Current or Ex-Partner: No    Emotionally Abused: No    Physically Abused: No    Sexually Abused: No    Past Surgical History:  Procedure Laterality Date   CATARACT EXTRACTION, BILATERAL Bilateral    CHOLECYSTECTOMY      Family History  Problem Relation Age of Onset   Heart failure Mother    Hypertension Mother    Hypothyroidism Mother    Hodgkin's lymphoma Sister    Hypothyroidism Maternal Grandmother    Colon cancer Neg Hx    Stomach cancer Neg Hx     No Known Allergies  Current Outpatient Medications on File Prior to Visit  Medication Sig Dispense Refill   albuterol (VENTOLIN HFA) 108 (90 Base) MCG/ACT inhaler 1-2 puff as needed Inhalation q 4-6 hours prn cough/wheeze for 90 days     amoxicillin-clavulanate (AUGMENTIN) 875-125 MG tablet Take 1 tablet by mouth 2 (two) times daily. 20 tablet 0   atenolol (TENORMIN) 25 MG tablet TAKE 1 TABLET BY MOUTH EVERY DAY 90 tablet 3   Azelastine HCl 137 MCG/SPRAY SOLN SPRAY 2 SPRAYS INTO BOTH NOSTRILS 2 (TWO) TIMES DAILY AS DIRECTED 90 mL 1   BREO ELLIPTA 200-25 MCG/ACT AEPB 1 puff Inhalation Once a day for 30 days     chlorthalidone (HYGROTON) 25 MG tablet TAKE 1/2 TABLET BY MOUTH EVERY DAY 45 tablet 0   EPINEPHrine 0.3 mg/0.3 mL IJ SOAJ injection one autoinjector Injection prn anaphylaxis, may repeat dose x1 for 30 days     fluticasone (FLONASE) 50 MCG/ACT nasal spray 1-2 sprays each nostril Nasally Once a day for 30 days     levocetirizine (XYZAL) 5 MG tablet 1 tablet in the  evening Orally Once a day for 30 days     levothyroxine (SYNTHROID) 75 MCG tablet TAKE 1 TABLET BY MOUTH EVERY DAY 90 tablet 2   montelukast (SINGULAIR) 10 MG tablet 1 tablet Orally Once a day for 30 days     sildenafil (REVATIO) 20 MG tablet Take 1 to 2 tabs 2 - 3 hours before sex 30 tablet 11   tamsulosin (FLOMAX) 0.4 MG CAPS capsule TAKE 1 CAPSULE BY MOUTH EVERY DAY (Patient not taking: Reported on 01/31/2024) 90 capsule 1   No current facility-administered medications on file prior to visit.    BP 126/70   Pulse (!) 57   Temp 98.1 F (36.7 C) (Oral)   Ht 5'  10.5" (1.791 m)   Wt 239 lb (108.4 kg)   SpO2 98%   BMI 33.81 kg/m       Objective:   Physical Exam Vitals and nursing note reviewed.  Constitutional:      General: He is not in acute distress.    Appearance: Normal appearance. He is not ill-appearing.  HENT:     Head: Normocephalic and atraumatic.     Right Ear: Tympanic membrane, ear canal and external ear normal. There is no impacted cerumen.     Left Ear: Tympanic membrane, ear canal and external ear normal. There is no impacted cerumen.     Nose: Nose normal. No congestion or rhinorrhea.     Mouth/Throat:     Mouth: Mucous membranes are moist.     Pharynx: Oropharynx is clear.  Eyes:     Extraocular Movements: Extraocular movements intact.     Conjunctiva/sclera: Conjunctivae normal.     Pupils: Pupils are equal, round, and reactive to light.  Neck:     Vascular: No carotid bruit.  Cardiovascular:     Rate and Rhythm: Normal rate and regular rhythm.     Pulses: Normal pulses.     Heart sounds: No murmur heard.    No friction rub. No gallop.  Pulmonary:     Effort: Pulmonary effort is normal.     Breath sounds: Normal breath sounds.  Abdominal:     General: Abdomen is flat. Bowel sounds are normal. There is no distension.     Palpations: Abdomen is soft. There is no mass.     Tenderness: There is no abdominal tenderness. There is no guarding or rebound.      Hernia: No hernia is present.  Musculoskeletal:        General: Normal range of motion.     Cervical back: Normal range of motion and neck supple.  Lymphadenopathy:     Cervical: No cervical adenopathy.  Skin:    General: Skin is warm and dry.     Capillary Refill: Capillary refill takes less than 2 seconds.  Neurological:     General: No focal deficit present.     Mental Status: He is alert and oriented to person, place, and time.  Psychiatric:        Mood and Affect: Mood normal.        Behavior: Behavior normal.        Thought Content: Thought content normal.        Judgment: Judgment normal.        Assessment & Plan:  1. Routine general medical examination at a health care facility (Primary) Today patient counseled on age appropriate routine health concerns for screening and prevention, each reviewed and up to date or declined. Immunizations reviewed and up to date or declined. Labs ordered and reviewed. Risk factors for depression reviewed and negative. Hearing function and visual acuity are intact. ADLs screened and addressed as needed. Functional ability and level of safety reviewed and appropriate. Education, counseling and referrals performed based on assessed risks today. Patient provided with a copy of personalized plan for preventive services. - Follow up in one year or sooner if needed - Work on weight loss through diet and exercise - He is going to call and schedule his colonoscopy   2. Essential hypertension - Well controlled. No change in medication  - Lipid panel; Future - TSH; Future - CBC; Future - Comprehensive metabolic panel; Future  3. Hypothyroidism, unspecified type - Consider dose change  of synthroid  - Lipid panel; Future - TSH; Future - CBC; Future - Comprehensive metabolic panel; Future  4. Erectile dysfunction, unspecified erectile dysfunction type  - sildenafil (VIAGRA) 100 MG tablet; Take 1 tablet (100 mg total) by mouth daily as needed  for erectile dysfunction.  Dispense: 90 tablet; Refill: 1  5. Mixed hyperlipidemia - Consider adding statin  back statin - TSH; Future - CBC; Future - Comprehensive metabolic panel; Future  6. Benign prostatic hyperplasia without lower urinary tract symptoms  - PSA; Future  Shirline Frees, NP

## 2024-02-01 ENCOUNTER — Other Ambulatory Visit (INDEPENDENT_AMBULATORY_CARE_PROVIDER_SITE_OTHER): Payer: Medicare HMO

## 2024-02-01 ENCOUNTER — Encounter: Payer: Self-pay | Admitting: Adult Health

## 2024-02-01 DIAGNOSIS — N400 Enlarged prostate without lower urinary tract symptoms: Secondary | ICD-10-CM

## 2024-02-01 DIAGNOSIS — R3914 Feeling of incomplete bladder emptying: Secondary | ICD-10-CM

## 2024-02-01 DIAGNOSIS — N401 Enlarged prostate with lower urinary tract symptoms: Secondary | ICD-10-CM | POA: Diagnosis not present

## 2024-02-01 DIAGNOSIS — E782 Mixed hyperlipidemia: Secondary | ICD-10-CM

## 2024-02-01 DIAGNOSIS — E039 Hypothyroidism, unspecified: Secondary | ICD-10-CM | POA: Diagnosis not present

## 2024-02-01 DIAGNOSIS — I10 Essential (primary) hypertension: Secondary | ICD-10-CM | POA: Diagnosis not present

## 2024-02-01 LAB — CBC WITH DIFFERENTIAL/PLATELET
Basophils Absolute: 0 10*3/uL (ref 0.0–0.1)
Basophils Relative: 1.1 % (ref 0.0–3.0)
Eosinophils Absolute: 0.3 10*3/uL (ref 0.0–0.7)
Eosinophils Relative: 7.2 % — ABNORMAL HIGH (ref 0.0–5.0)
HCT: 44.3 % (ref 39.0–52.0)
Hemoglobin: 15.2 g/dL (ref 13.0–17.0)
Lymphocytes Relative: 36.2 % (ref 12.0–46.0)
Lymphs Abs: 1.6 10*3/uL (ref 0.7–4.0)
MCHC: 34.3 g/dL (ref 30.0–36.0)
MCV: 96.4 fl (ref 78.0–100.0)
Monocytes Absolute: 0.5 10*3/uL (ref 0.1–1.0)
Monocytes Relative: 11 % (ref 3.0–12.0)
Neutro Abs: 2 10*3/uL (ref 1.4–7.7)
Neutrophils Relative %: 44.5 % (ref 43.0–77.0)
Platelets: 170 10*3/uL (ref 150.0–400.0)
RBC: 4.6 Mil/uL (ref 4.22–5.81)
RDW: 13.1 % (ref 11.5–15.5)
WBC: 4.4 10*3/uL (ref 4.0–10.5)

## 2024-02-01 LAB — COMPREHENSIVE METABOLIC PANEL
ALT: 37 U/L (ref 0–53)
AST: 31 U/L (ref 0–37)
Albumin: 3.9 g/dL (ref 3.5–5.2)
Alkaline Phosphatase: 77 U/L (ref 39–117)
BUN: 19 mg/dL (ref 6–23)
CO2: 28 meq/L (ref 19–32)
Calcium: 8.9 mg/dL (ref 8.4–10.5)
Chloride: 101 meq/L (ref 96–112)
Creatinine, Ser: 0.9 mg/dL (ref 0.40–1.50)
GFR: 83.46 mL/min (ref >60.00)
Glucose, Bld: 94 mg/dL (ref 70–99)
Potassium: 4.6 meq/L (ref 3.5–5.1)
Sodium: 136 meq/L (ref 135–145)
Total Bilirubin: 1.1 mg/dL (ref 0.2–1.2)
Total Protein: 7 g/dL (ref 6.0–8.3)

## 2024-02-01 LAB — LIPID PANEL
Cholesterol: 168 mg/dL (ref 0–200)
HDL: 51.7 mg/dL (ref >39.00)
LDL Cholesterol: 99 mg/dL (ref 0–99)
NonHDL: 116.76
Total CHOL/HDL Ratio: 3
Triglycerides: 87 mg/dL (ref 0.0–149.0)
VLDL: 17.4 mg/dL (ref 0.0–40.0)

## 2024-02-01 LAB — PSA: PSA: 3.2 ng/mL (ref 0.10–4.00)

## 2024-02-01 LAB — TSH: TSH: 3.19 u[IU]/mL (ref 0.35–5.50)

## 2024-02-02 ENCOUNTER — Encounter: Payer: Self-pay | Admitting: Internal Medicine

## 2024-02-06 DIAGNOSIS — J3081 Allergic rhinitis due to animal (cat) (dog) hair and dander: Secondary | ICD-10-CM | POA: Diagnosis not present

## 2024-02-06 DIAGNOSIS — J301 Allergic rhinitis due to pollen: Secondary | ICD-10-CM | POA: Diagnosis not present

## 2024-02-06 DIAGNOSIS — J3089 Other allergic rhinitis: Secondary | ICD-10-CM | POA: Diagnosis not present

## 2024-02-08 ENCOUNTER — Ambulatory Visit (AMBULATORY_SURGERY_CENTER): Payer: Medicare HMO

## 2024-02-08 VITALS — Ht 70.5 in | Wt 230.0 lb

## 2024-02-08 DIAGNOSIS — Z1211 Encounter for screening for malignant neoplasm of colon: Secondary | ICD-10-CM

## 2024-02-08 MED ORDER — NA SULFATE-K SULFATE-MG SULF 17.5-3.13-1.6 GM/177ML PO SOLN: 1.0000 | Freq: Once | ORAL | 0 refills | Status: AC

## 2024-02-08 NOTE — Progress Notes (Signed)

## 2024-02-09 DIAGNOSIS — J3089 Other allergic rhinitis: Secondary | ICD-10-CM | POA: Diagnosis not present

## 2024-02-09 DIAGNOSIS — J3081 Allergic rhinitis due to animal (cat) (dog) hair and dander: Secondary | ICD-10-CM | POA: Diagnosis not present

## 2024-02-13 DIAGNOSIS — J3081 Allergic rhinitis due to animal (cat) (dog) hair and dander: Secondary | ICD-10-CM | POA: Diagnosis not present

## 2024-02-13 DIAGNOSIS — J301 Allergic rhinitis due to pollen: Secondary | ICD-10-CM | POA: Diagnosis not present

## 2024-02-13 DIAGNOSIS — J3089 Other allergic rhinitis: Secondary | ICD-10-CM | POA: Diagnosis not present

## 2024-02-22 ENCOUNTER — Encounter: Payer: Self-pay | Admitting: Internal Medicine

## 2024-02-22 DIAGNOSIS — J301 Allergic rhinitis due to pollen: Secondary | ICD-10-CM | POA: Diagnosis not present

## 2024-02-22 DIAGNOSIS — J3081 Allergic rhinitis due to animal (cat) (dog) hair and dander: Secondary | ICD-10-CM | POA: Diagnosis not present

## 2024-02-22 DIAGNOSIS — J3089 Other allergic rhinitis: Secondary | ICD-10-CM | POA: Diagnosis not present

## 2024-02-27 DIAGNOSIS — J3089 Other allergic rhinitis: Secondary | ICD-10-CM | POA: Diagnosis not present

## 2024-02-27 DIAGNOSIS — J3081 Allergic rhinitis due to animal (cat) (dog) hair and dander: Secondary | ICD-10-CM | POA: Diagnosis not present

## 2024-02-27 DIAGNOSIS — J301 Allergic rhinitis due to pollen: Secondary | ICD-10-CM | POA: Diagnosis not present

## 2024-02-29 ENCOUNTER — Encounter: Payer: Self-pay | Admitting: Internal Medicine

## 2024-02-29 ENCOUNTER — Ambulatory Visit: Payer: Medicare HMO | Admitting: Internal Medicine

## 2024-02-29 VITALS — BP 102/58 | HR 83 | Temp 98.4°F | Resp 15 | Ht 70.0 in | Wt 230.0 lb

## 2024-02-29 DIAGNOSIS — K5730 Diverticulosis of large intestine without perforation or abscess without bleeding: Secondary | ICD-10-CM | POA: Diagnosis not present

## 2024-02-29 DIAGNOSIS — E039 Hypothyroidism, unspecified: Secondary | ICD-10-CM | POA: Diagnosis not present

## 2024-02-29 DIAGNOSIS — K635 Polyp of colon: Secondary | ICD-10-CM | POA: Diagnosis not present

## 2024-02-29 DIAGNOSIS — I10 Essential (primary) hypertension: Secondary | ICD-10-CM | POA: Diagnosis not present

## 2024-02-29 DIAGNOSIS — D122 Benign neoplasm of ascending colon: Secondary | ICD-10-CM | POA: Diagnosis not present

## 2024-02-29 DIAGNOSIS — Z1211 Encounter for screening for malignant neoplasm of colon: Secondary | ICD-10-CM

## 2024-02-29 DIAGNOSIS — E669 Obesity, unspecified: Secondary | ICD-10-CM | POA: Diagnosis not present

## 2024-02-29 DIAGNOSIS — Z860100 Personal history of colon polyps, unspecified: Secondary | ICD-10-CM

## 2024-02-29 MED ORDER — SODIUM CHLORIDE 0.9 % IV SOLN: 500.0000 mL | Freq: Once | INTRAVENOUS | Status: DC

## 2024-02-29 NOTE — Op Note (Signed)
 [facility] Patient Name: Edward Miles Procedure Date: 02/29/2024 3:34 PM MRN: [id number] Endoscopist: Wilhemina Bonito. Marina Goodell , MD, [id number] Age: 78 Referring MD:  Date of Birth: May 24, 1946 Gender: Male Account #: [id number] Procedure:                Colonoscopy with hot snare polypectomy x 1; cold                            snare polypectomy x 1 Indications:              High risk colon cancer surveillance: Personal                            history of non-advanced adenoma. Previous                            examinations 2009, 2015 Medicines:                Monitored Anesthesia Care Procedure:                Pre-Anesthesia Assessment:                           - Prior to the procedure, a History and Physical                            was performed, and patient medications and                            allergies were reviewed. The patient's tolerance of                            previous anesthesia was also reviewed. The risks                            and benefits of the procedure and the sedation                            options and risks were discussed with the patient.                            All questions were answered, and informed consent                            was obtained. Prior Anticoagulants: The patient has                            taken no anticoagulant or antiplatelet agents. ASA                            Grade Assessment: II - A patient with mild systemic                            disease. After reviewing the risks and benefits,  the patient was deemed in satisfactory condition to                            undergo the procedure.                           After obtaining informed consent, the colonoscope                            was passed under direct vision. Throughout the                            procedure, the patient's blood pressure, pulse, and                            oxygen saturations were monitored  continuously. The                            CF [id number] #[id number] was introduced through the anus                            and advanced to the the cecum, identified by                            appendiceal orifice and ileocecal valve. The                            ileocecal valve, appendiceal orifice, and rectum                            were photographed. The quality of the bowel                            preparation was excellent. The colonoscopy was                            performed without difficulty. The patient tolerated                            the procedure well. The bowel preparation used was                            SUPREP via split dose instruction. Scope In: 3:59:25 PM Scope Out: 4:13:32 PM Scope Withdrawal Time: 0 hours 10 minutes 11 seconds  Total Procedure Duration: 0 hours 14 minutes 7 seconds  Findings:                 A 5 mm polyp was found in the ascending colon. The                            polyp was removed with a cold snare. Resection and                            retrieval were complete.  A 15 mm polyp was found in the ascending colon. The                            polyp was semi-pedunculated. The polyp was removed                            with a hot snare. Resection and retrieval were                            complete.                           A few diverticula were found in the sigmoid colon.                           The exam was otherwise without abnormality on                            direct and retroflexion views. Complications:            No immediate complications. Estimated blood loss:                            None. Estimated Blood Loss:     Estimated blood loss: none. Impression:               - One 5 mm polyp in the ascending colon, removed                            with a cold snare. Resected and retrieved.                           - One 15 mm polyp in the ascending colon, removed                             with a hot snare. Resected and retrieved.                           - Diverticulosis in the sigmoid colon.                           - The examination was otherwise normal on direct                            and retroflexion views. Recommendation:           - Repeat colonoscopy in 3 years for surveillance.                           - Patient has a contact number available for                            emergencies. The signs and symptoms of potential  delayed complications were discussed with the                            patient. Return to normal activities tomorrow.                            Written discharge instructions were provided to the                            patient.                           - Resume previous diet.                           - Continue present medications.                           - Await pathology results. Wilhemina Bonito. Marina Goodell, MD 02/29/2024 4:19:47 PM This report has been signed electronically.

## 2024-02-29 NOTE — Progress Notes (Signed)
 Called to room to assist during endoscopic procedure.  Patient ID and intended procedure confirmed with present staff. Received instructions for my participation in the procedure from the performing physician.

## 2024-02-29 NOTE — Progress Notes (Signed)
 HISTORY OF PRESENT ILLNESS:  Edward Miles is a 78 y.o. male with a history of adenomatous colon polyp 2009.  Negative exam 2015.  Now for surveillance  REVIEW OF SYSTEMS:  All non-GI ROS negative except for  Past Medical History:  Diagnosis Date   Colon polyps    Diverticulosis    ED (erectile dysfunction)    Hypertension    Hypothyroidism    Obese     Past Surgical History:  Procedure Laterality Date   CATARACT EXTRACTION, BILATERAL Bilateral    CHOLECYSTECTOMY      Social History Edward Miles  reports that he has never smoked. He has never been exposed to tobacco smoke. He has never used smokeless tobacco. He reports that he does not drink alcohol and does not use drugs.  family history includes Heart failure in his mother; Hodgkin's lymphoma in his sister; Hypertension in his mother; Hypothyroidism in his maternal grandmother and mother.  No Known Allergies     PHYSICAL EXAMINATION: Vital signs: BP (!) 161/81   Pulse 79   Temp 98.4 F (36.9 C)   Ht 5\' 10"  (1.778 m)   Wt 230 lb (104.3 kg)   SpO2 98%   BMI 33.00 kg/m  General: Well-developed, well-nourished, no acute distress HEENT: Sclerae are anicteric, conjunctiva pink. Oral mucosa intact Lungs: Clear Heart: Regular Abdomen: soft, nontender, nondistended, no obvious ascites, no peritoneal signs, normal bowel sounds. No organomegaly. Extremities: No edema Psychiatric: alert and oriented x3. Cooperative     ASSESSMENT:  History of adenomatous polyp   PLAN:   Surveillance colonoscopy

## 2024-02-29 NOTE — Progress Notes (Signed)
 Report to PACU, RN, vss, BBS= Clear.

## 2024-02-29 NOTE — Patient Instructions (Signed)
   Handouts on polyps & diverticulosis given to you today.   Await pathology results on polyps removed   Continue previous diet & medications    YOU HAD AN ENDOSCOPIC PROCEDURE TODAY AT THE [facility]:   Refer to the procedure report that was given to you for any specific questions about what was found during the examination.  If the procedure report does not answer your questions, please call your gastroenterologist to clarify.  If you requested that your care partner not be given the details of your procedure findings, then the procedure report has been included in a sealed envelope for you to review at your convenience later.  YOU SHOULD EXPECT: Some feelings of bloating in the abdomen. Passage of more gas than usual.  Walking can help get rid of the air that was put into your GI tract during the procedure and reduce the bloating. If you had a lower endoscopy (such as a colonoscopy or flexible sigmoidoscopy) you may notice spotting of blood in your stool or on the toilet paper. If you underwent a bowel prep for your procedure, you may not have a normal bowel movement for a few days.  Please Note:  You might notice some irritation and congestion in your nose or some drainage.  This is from the oxygen used during your procedure.  There is no need for concern and it should clear up in a day or so.  SYMPTOMS TO REPORT IMMEDIATELY:  Following lower endoscopy (colonoscopy or flexible sigmoidoscopy):  Excessive amounts of blood in the stool  Significant tenderness or worsening of abdominal pains  Swelling of the abdomen that is new, acute  Fever of 100F or higher   For urgent or emergent issues, a gastroenterologist can be reached at any hour by calling [phone]. Do not use MyChart messaging for urgent concerns.    DIET:  We do recommend a small meal at first, but then you may proceed to your regular diet.  Drink plenty of fluids but you should avoid alcoholic  beverages for 24 hours.  ACTIVITY:  You should plan to take it easy for the rest of today and you should NOT DRIVE or use heavy machinery until tomorrow (because of the sedation medicines used during the test).    FOLLOW UP: Our staff will call the number listed on your records the next business day following your procedure.  We will call around 7:15- 8:00 am to check on you and address any questions or concerns that you may have regarding the information given to you following your procedure. If we do not reach you, we will leave a message.     If any biopsies were taken you will be contacted by phone or by letter within the next 1-3 weeks.  Please call us at [phone] if you have not heard about the biopsies in 3 weeks.    SIGNATURES/CONFIDENTIALITY: You and/or your care partner have signed paperwork which will be entered into your electronic medical record.  These signatures attest to the fact that that the information above on your After Visit Summary has been reviewed and is understood.  Full responsibility of the confidentiality of this discharge information lies with you and/or your care-partner.

## 2024-02-29 NOTE — Progress Notes (Signed)
 Pt's states no medical or surgical changes since previsit or office visit.

## 2024-03-01 ENCOUNTER — Telehealth: Payer: Self-pay

## 2024-03-01 NOTE — Telephone Encounter (Signed)
  Follow up Call-     02/29/2024    2:46 PM  Call back number  Post procedure Call Back phone  # [phone]  Permission to leave phone message Yes     Patient questions:  Do you have a fever, pain , or abdominal swelling? No. Pain Score  0 *  Have you tolerated food without any problems? Yes.    Have you been able to return to your normal activities? Yes.    Do you have any questions about your discharge instructions: Diet   No. Medications  No. Follow up visit  No.  Do you have questions or concerns about your Care? No.  Actions: * If pain score is 4 or above: No action needed, pain <4.

## 2024-03-05 DIAGNOSIS — J3089 Other allergic rhinitis: Secondary | ICD-10-CM | POA: Diagnosis not present

## 2024-03-05 DIAGNOSIS — J301 Allergic rhinitis due to pollen: Secondary | ICD-10-CM | POA: Diagnosis not present

## 2024-03-05 DIAGNOSIS — J3081 Allergic rhinitis due to animal (cat) (dog) hair and dander: Secondary | ICD-10-CM | POA: Diagnosis not present

## 2024-03-06 ENCOUNTER — Encounter: Payer: Self-pay | Admitting: Internal Medicine

## 2024-03-06 LAB — SURGICAL PATHOLOGY

## 2024-03-19 DIAGNOSIS — J301 Allergic rhinitis due to pollen: Secondary | ICD-10-CM | POA: Diagnosis not present

## 2024-03-19 DIAGNOSIS — J3089 Other allergic rhinitis: Secondary | ICD-10-CM | POA: Diagnosis not present

## 2024-03-19 DIAGNOSIS — J3081 Allergic rhinitis due to animal (cat) (dog) hair and dander: Secondary | ICD-10-CM | POA: Diagnosis not present

## 2024-03-26 DIAGNOSIS — J3081 Allergic rhinitis due to animal (cat) (dog) hair and dander: Secondary | ICD-10-CM | POA: Diagnosis not present

## 2024-03-26 DIAGNOSIS — J3089 Other allergic rhinitis: Secondary | ICD-10-CM | POA: Diagnosis not present

## 2024-03-26 DIAGNOSIS — J301 Allergic rhinitis due to pollen: Secondary | ICD-10-CM | POA: Diagnosis not present

## 2024-03-28 ENCOUNTER — Institutional Professional Consult (permissible substitution) (INDEPENDENT_AMBULATORY_CARE_PROVIDER_SITE_OTHER): Payer: Medicare HMO

## 2024-03-28 NOTE — Progress Notes (Deleted)
 Dear Dr. Annemarie Barry, Here is my assessment for our mutual patient, Edward Miles. Thank you for allowing me the opportunity to care for your patient. Please do not hesitate to contact me should you have any other questions. Sincerely, Dr. Milon Aloe  Otolaryngology Clinic Note  HISTORY: Edward Miles is a 78 y.o. male with history of *** kindly referred by Dr. Nafziger for evaluation of ***.   He reports ***.  Current symptoms include ***.  He denies ***.  Symptoms began *** ago.  Symptom severity is ***.  Improvement occurred with ***.  Additional evaluation has included ***.  Allergy testing *** been done.  *** No previous sinonasal surgery.  He is currently using ***.  GLP-1: *** AP/AC: ***  Tobacco: *** Lives in ***  PMHx: HTN, Hypothyroidism  RADIOGRAPHIC EVALUATION AND INDEPENDENT REVIEW OF OTHER RECORDS:: Dr. Almeda Jacobs ([facility]) notes 02/27/2024: noted rhinitis on singulair and zyrtec; on AIT; currently on zyrtec, flonase, singulair, astelin , breo, and saline irrigations; Has chronic sinusitis, referred to [facility] [place] (12/20/2023 and 01/26/2024): noted recurrent sinusitis - congestion, headaches, sinus pressure; Rx: Augmentin , sinus CT; On sinus CT, noted left sphenoid sinusitis, prior on augmentin  - ref to ENT Lab: CBC 02/01/2024: WBC 4.4, Hgb 15.2, Plt 170, Eos 300; CMP 02/01/2024: BUN/Cr 19/0.9 CT Sinus 01/09/2024 independently interpreted: right septal deviation, generally chronic pansinusitis with significant b/l ethmoid disease, left sphenoid opacification and bilateral frontal opacification; some osteitic bone as well, mesenteried anterior ethmoid arteries, carotid and optic covered; type 4 cell right frontal?; small dehiscence left sphenoid superiorly - some dual density - query fungal elements?   Past Medical History:  Diagnosis Date   Colon polyps    Diverticulosis    ED (erectile dysfunction)    Hypertension    Hypothyroidism    Obese    Past  Surgical History:  Procedure Laterality Date   CATARACT EXTRACTION, BILATERAL Bilateral    CHOLECYSTECTOMY     Family History  Problem Relation Age of Onset   Heart failure Mother    Hypertension Mother    Hypothyroidism Mother    Hodgkin's lymphoma Sister    Hypothyroidism Maternal Grandmother    Colon cancer Neg Hx    Stomach cancer Neg Hx    Rectal cancer Neg Hx    Social History   Tobacco Use   Smoking status: Never    Passive exposure: Never   Smokeless tobacco: Never  Substance Use Topics   Alcohol use: No   No Known Allergies Current Outpatient Medications  Medication Sig Dispense Refill   albuterol (VENTOLIN HFA) 108 (90 Base) MCG/ACT inhaler 1-2 puff as needed Inhalation q 4-6 hours prn cough/wheeze for 90 days     atenolol  (TENORMIN ) 25 MG tablet TAKE 1 TABLET BY MOUTH EVERY DAY 90 tablet 3   Azelastine  HCl 137 MCG/SPRAY SOLN SPRAY 2 SPRAYS INTO BOTH NOSTRILS 2 (TWO) TIMES DAILY AS DIRECTED 90 mL 1   BREO ELLIPTA 200-25 MCG/ACT AEPB 1 puff Inhalation Once a day for 30 days     chlorthalidone  (HYGROTON ) 25 MG tablet TAKE 1/2 TABLET BY MOUTH EVERY DAY 45 tablet 0   EPINEPHrine 0.3 mg/0.3 mL IJ SOAJ injection one autoinjector Injection prn anaphylaxis, may repeat dose x1 for 30 days (Patient not taking: Reported on 02/29/2024)     fluticasone (FLONASE) 50 MCG/ACT nasal spray 1-2 sprays each nostril Nasally Once a day for 30 days (Patient not taking: Reported on 02/29/2024)     levocetirizine (XYZAL) 5 MG tablet 1  tablet in the evening Orally Once a day for 30 days     levothyroxine  (SYNTHROID ) 75 MCG tablet TAKE 1 TABLET BY MOUTH EVERY DAY 90 tablet 2   montelukast (SINGULAIR) 10 MG tablet 1 tablet Orally Once a day for 30 days     sildenafil  (VIAGRA ) 100 MG tablet Take 1 tablet (100 mg total) by mouth daily as needed for erectile dysfunction. 90 tablet 1   No current facility-administered medications for this visit.   There were no vitals taken for this  visit.  PHYSICAL EXAM:  There were no vitals taken for this visit.   Salient findings:  CN II-XII intact *** Bilateral EAC clear and TM intact with well pneumatized middle ear spaces Weber 512: Rinne 512: AC > BC b/l *** Rine 1024: AC > BC b/l *** Nose: Anterior rhinoscopy reveals ***.  Nasal endoscopy was indicated to better evaluate the nose and paranasal sinuses, given the patient's history and exam findings, and is detailed below. No lesions of oral cavity/oropharynx; dentition *** No obviously palpable neck masses/lymphadenopathy/thyromegaly No respiratory distress or stridor***   PROCEDURE:  Prior to initiating any procedures, risks/benefits/alternatives were explained to the patient and verbal consent obtained. Diagnostic Nasal Endoscopy Pre-procedure diagnosis: Concern for *** Post-procedure diagnosis: same Indication: See pre-procedure diagnosis and physical exam above Complications: None apparent EBL: *** mL Anesthesia: Lidocaine 4% and topical decongestant was topically sprayed in each nasal cavity  Description of Procedure:  Patient was identified. A rigid 30 degree endoscope was utilized to evaluate the sinonasal cavities, mucosa, sinus ostia and turbinates and septum.  Overall, signs of mucosal inflammation are*** noted.  Also noted are ***.  No mucopurulence, polyps, or masses noted.   Right Middle meatus: *** Right SE Recess: *** Left MM: *** Left SE Recess: *** Photodocumentation was obtained.  CPT CODE -- 31231 - Mod 25   ASSESSMENT:  ***  No diagnosis found.   PLAN: We've discussed issues and options today.  We reviewed the nasal endoscopy images together.  The risks, benefits and alternatives were discussed and questions answered.  He has elected to proceed with:  1) 2) Follow-up in *** -- sooner as necessary.  See below regarding exact medications prescribed this encounter including dosages and route: No orders of the defined types were placed in  this encounter.    Thank you for allowing me the opportunity to care for your patient. Please do not hesitate to contact me should you have any other questions.  Sincerely, Milon Aloe, MD Otolaryngologist (ENT), [facility] Phone: [phone] Fax: [phone]  MDM:  Level ***: 99*** Complexity/Problems addressed: *** Data complexity: *** independent interpretation of *** - Morbidity: ***  - Prescription Drug prescribed or managed: ***  03/28/2024, 8:11 AM

## 2024-04-03 ENCOUNTER — Ambulatory Visit (INDEPENDENT_AMBULATORY_CARE_PROVIDER_SITE_OTHER): Admitting: Otolaryngology

## 2024-04-03 ENCOUNTER — Ambulatory Visit (INDEPENDENT_AMBULATORY_CARE_PROVIDER_SITE_OTHER): Payer: Medicare HMO | Admitting: Family Medicine

## 2024-04-03 VITALS — BP 156/85 | HR 62 | Ht 71.0 in | Wt 235.0 lb

## 2024-04-03 DIAGNOSIS — J3489 Other specified disorders of nose and nasal sinuses: Secondary | ICD-10-CM | POA: Diagnosis not present

## 2024-04-03 DIAGNOSIS — Z0000 Encounter for general adult medical examination without abnormal findings: Secondary | ICD-10-CM

## 2024-04-03 DIAGNOSIS — J301 Allergic rhinitis due to pollen: Secondary | ICD-10-CM | POA: Diagnosis not present

## 2024-04-03 DIAGNOSIS — J3081 Allergic rhinitis due to animal (cat) (dog) hair and dander: Secondary | ICD-10-CM | POA: Diagnosis not present

## 2024-04-03 DIAGNOSIS — J343 Hypertrophy of nasal turbinates: Secondary | ICD-10-CM

## 2024-04-03 DIAGNOSIS — J342 Deviated nasal septum: Secondary | ICD-10-CM | POA: Diagnosis not present

## 2024-04-03 DIAGNOSIS — R0981 Nasal congestion: Secondary | ICD-10-CM | POA: Diagnosis not present

## 2024-04-03 DIAGNOSIS — J3089 Other allergic rhinitis: Secondary | ICD-10-CM | POA: Diagnosis not present

## 2024-04-03 DIAGNOSIS — J324 Chronic pansinusitis: Secondary | ICD-10-CM

## 2024-04-03 DIAGNOSIS — J309 Allergic rhinitis, unspecified: Secondary | ICD-10-CM

## 2024-04-03 NOTE — Patient Instructions (Signed)
 I really enjoyed getting to talk with you today! I am available on Tuesdays and Thursdays for virtual visits if you have any questions or concerns, or if I can be of any further assistance.   CHECKLIST FROM ANNUAL WELLNESS VISIT:  -Follow up (please call to schedule if not scheduled after visit):   -yearly for annual wellness visit with primary care office  Here is a list of your preventive care/health maintenance measures and the plan for each if any are due:  PLAN For any measures below that may be due:  -can get the vaccines at the pharmacy, please let us  know if you do so that we can update your record  Health Maintenance  Topic Date Due   COVID-19 Vaccine (5 - 2024-25 season) 08/07/2023   DTaP/Tdap/Td (3 - Td or Tdap) 11/23/2023   INFLUENZA VACCINE  07/06/2024   Medicare Annual Wellness (AWV)  04/03/2025   Colonoscopy  03/01/2027   Pneumonia Vaccine 25+ Years old  Completed   Hepatitis C Screening  Completed   Zoster Vaccines- Shingrix  Completed   HPV VACCINES  Aged Out   Meningococcal B Vaccine  Aged Out    -See a dentist at least yearly  -Get your eyes checked and then per your eye specialist's recommendations  -Other issues addressed today:   -I have included below further information regarding a healthy whole foods based diet, physical activity guidelines for adults, stress management and opportunities for social connections. I hope you find this information useful.   -----------------------------------------------------------------------------------------------------------------------------------------------------------------------------------------------------------------------------------------------------------    NUTRITION: -eat real food: lots of colorful vegetables (half the plate) and fruits -5-7 servings of vegetables and fruits per day (fresh or steamed is best), exp. 2 servings of vegetables with lunch and dinner and 2 servings of fruit per day. Berries  and greens such as kale and collards are great choices.  -consume on a regular basis:  fresh fruits, fresh veggies, fish, nuts, seeds, healthy oils (such as olive oil, avocado oil), whole grains (make sure for bread/pasta/crackers/etc., that the first ingredient on label contains the word "whole"), legumes. -can eat small amounts of dairy and lean meat (no larger than the palm of your hand), but avoid processed meats such as ham, bacon, lunch meat, etc. -drink water -try to avoid fast food and pre-packaged foods, processed meat, ultra processed foods/beverages (donuts, candy, etc.) -most experts advise limiting sodium to < 2300mg  per day, should limit further is any chronic conditions such as high blood pressure, heart disease, diabetes, etc. The American Heart Association advised that < 1500mg  is is ideal -try to avoid foods/beverages that contain any ingredients with names you do not recognize  -try to avoid foods/beverages  with added sugar or sweeteners/sweets  -try to avoid sweet drinks (including diet drinks): soda, juice, Gatorade, sweet tea, power drinks, diet drinks -try to avoid white rice, white bread, pasta (unless whole grain)  EXERCISE GUIDELINES FOR ADULTS: -if you wish to increase your physical activity, do so gradually and with the approval of your doctor -STOP and seek medical care immediately if you have any chest pain, chest discomfort or trouble breathing when starting or increasing exercise  -move and stretch your body, legs, feet and arms when sitting for long periods -Physical activity guidelines for optimal health in adults: -get at least 150 minutes per week of moderate exercise (can talk, but not sing); this is about 20-30 minutes of sustained activity 5-7 days per week or two 10-15 minute episodes of sustained activity 5-7 days  per week -do some muscle building/resistance training/strength training at least 2 days per week  -balance exercises 3+ days per week:   Stand  somewhere where you have something sturdy to hold onto if you lose balance    1) lift up on toes, then back down, start with 5x per day and work up to 20x   2) stand and lift one leg straight out to the side so that foot is a few inches of the floor, start with 5x each side and work up to 20x each side   3) stand on one foot, start with 5 seconds each side and work up to 20 seconds on each side  If you need ideas or help with getting more active:  -Silver sneakers https://tools.silversneakers.com  -Walk with a Doc: http://www.duncan-williams.com/  -try to include resistance (weight lifting/strength building) and balance exercises twice per week: or the following link for ideas: http://castillo-powell.com/  BuyDucts.dk  STRESS MANAGEMENT: -can try meditating, or just sitting quietly with deep breathing while intentionally relaxing all parts of your body for 5 minutes daily -if you need further help with stress, anxiety or depression please follow up with your primary doctor or contact the wonderful folks at [facility]: [phone]  SOCIAL CONNECTIONS: -options in [place] if you wish to engage in more social and exercise related activities:  -Silver sneakers https://tools.silversneakers.com  -Walk with a Doc: http://www.duncan-williams.com/  -Check out the [facility] Active Adults 50+ section on the [place] [employer] (hiking clubs, book clubs, cards and games, chess, exercise classes, aquatic classes and much more) - see the website for details: https://www.[place].gov/departments/parks-recreation/active-adults50  -YouTube has lots of exercise videos for different ages and abilities as well  -[facility] (a variety of indoor and outdoor inperson activities for adults). (607) 358-4333. [address].  -Virtual Online Classes (a variety of topics): see  seniorplanet.org or call 5708441020  -consider volunteering at a school, hospice center, church, senior center or elsewhere

## 2024-04-03 NOTE — Progress Notes (Signed)
 PATIENT CHECK-IN and HEALTH RISK ASSESSMENT QUESTIONNAIRE:  -completed by phone/video for upcoming Medicare Preventive Visit  Pre-Visit Check-in: 1)Vitals (height, wt, BP, etc) - record in vitals section for visit on day of visit Request home vitals (wt, BP, etc.) and enter into vitals, THEN update Vital Signs SmartPhrase below at the top of the HPI. See below.  2)Review and Update Medications, Allergies PMH, Surgeries, Social history in Epic 3)Hospitalizations in the last year with date/reason? n  4)Review and Update Care Team (patient's specialists) in Epic 5) Complete PHQ9 in Epic  6) Complete Fall Screening in Epic 7)Review all Health Maintenance Due and order under PCP if not done.  Medicare Wellness Patient Questionnaire:  Answer theses question about your habits: How often do you have a drink containing alcohol?n How many drinks containing alcohol do you have on a typical day when you are drinking?na How often do you have six or more drinks on one occasion?na Have you ever smoked? Never smoked How many packs a day do/did you smoke? na Do you use smokeless tobacco?na Do you use an illicit drugs?na On average, how many days per week do you engage in moderate to strenuous exercise (like a brisk walk)? 3 days a week for 30 minutes; hiking and plays golf Typical breakfast: cereal (shredded) with mild and banana Typical lunch: soup and sandwich Typical dinner: usually protein, and veggies Typical snacks: cookies, fruit  Beverages: water Social connections: socializes with family, friends or has outings more than 3-4 times per week  Answer theses question about your everyday activities: Can you perform most household chores?y Are you deaf or have significant trouble hearing?n Do you feel that you have a problem with memory?n Do you feel safe at home? y Last dentist visit? twice 8. Do you have any difficulty performing your everyday activities?n Are you having any difficulty  walking, taking medications on your own, and or difficulty managing daily home needs?n Do you have difficulty walking or climbing stairs?n Do you have difficulty dressing or bathing?n Do you have difficulty doing errands alone such as visiting a doctor's office or shopping?n Do you currently have any difficulty preparing food and eating?n Do you currently have any difficulty using the toilet?n Do you have any difficulty managing your finances?n Do you have any difficulties with housekeeping of managing your housekeeping?n   Do you have Advanced Directives in place (Living Will, Healthcare Power or Attorney)? y   Last eye Exam and location?see [facility], once yearly   Do you currently use prescribed or non-prescribed narcotic or opioid pain medications? n  Do you have a history or close family history of breast, ovarian, tubal or peritoneal cancer or a family member with BRCA (breast cancer susceptibility 1 and 2) gene mutations? n    ----------------------------------------------------------------------------------------------------------------------------------------------------------------------------------------------------------------------  Because this visit was a virtual/telehealth visit, some criteria may be missing or patient reported. Any vitals not documented were not able to be obtained and vitals that have been documented are patient reported.    MEDICARE ANNUAL PREVENTIVE CARE VISIT WITH PROVIDER (Welcome to Medicare, initial annual wellness or annual wellness exam)  Virtual Visit via Phone Note  I connected with Kristina E Caradine on 04/03/24  by phone and verified that I am speaking with the correct person using two identifiers. He tried to do a video visit and he could see me, but his camera was not working so I could not see him.   Location patient: home Location provider:work or home office Persons participating  in the virtual visit: patient,  provider  Concerns and/or follow up today: doing great   See HM section in Epic for other details of completed HM.    ROS: negative for report of fevers, unintentional weight loss, vision changes, vision loss, hearing loss or change, chest pain, sob, hemoptysis, melena, hematochezia, hematuria, falls, bleeding or bruising, thoughts of suicide or self harm, memory loss  Patient-completed extensive health risk assessment - reviewed and discussed with the patient: See Health Risk Assessment completed with patient prior to the visit either above or in recent phone note. This was reviewed in detailed with the patient today and appropriate recommendations, orders and referrals were placed as needed per Summary below and patient instructions.   Review of Medical History: -PMH, PSH, Family History and current specialty and care providers reviewed and updated and listed below   Patient Care Team: Alto Atta, NP as PCP - General (Family Medicine) [facility] as Consulting Physician (Orthopedic Surgery)   Past Medical History:  Diagnosis Date   Colon polyps    Diverticulosis    ED (erectile dysfunction)    Hypertension    Hypothyroidism    Obese     Past Surgical History:  Procedure Laterality Date   CATARACT EXTRACTION, BILATERAL Bilateral    CHOLECYSTECTOMY      Social History   Socioeconomic History   Marital status: Married    Spouse name: Not on file   Number of children: Not on file   Years of education: Not on file   Highest education level: Master's degree (e.g., MA, MS, MEng, MEd, MSW, MBA)  Occupational History   Not on file  Tobacco Use   Smoking status: Never    Passive exposure: Never   Smokeless tobacco: Never  Vaping Use   Vaping status: Never Used  Substance and Sexual Activity   Alcohol use: No   Drug use: No   Sexual activity: Not on file  Other Topics Concern   Not on file  Social History Narrative   Geologist, engineering    Married     Social Drivers of Health   Financial Resource Strain: Low Risk  (04/03/2024)   Overall Financial Resource Strain (CARDIA)    Difficulty of Paying Living Expenses: Not hard at all  Food Insecurity: No Food Insecurity (04/03/2024)   Hunger Vital Sign    Worried About Running Out of Food in the Last Year: Never true    Ran Out of Food in the Last Year: Never true  Transportation Needs: No Transportation Needs (04/03/2024)   PRAPARE - Administrator, Civil Service (Medical): No    Lack of Transportation (Non-Medical): No  Physical Activity: Insufficiently Active (04/03/2024)   Exercise Vital Sign    Days of Exercise per Week: 3 days    Minutes of Exercise per Session: 30 min  Stress: No Stress Concern Present (04/03/2024)   [employer] of Occupational Health - Occupational Stress Questionnaire    Feeling of Stress : Not at all  Social Connections: Moderately Integrated (04/03/2024)   Social Connection and Isolation Panel [NHANES]    Frequency of Communication with Friends and Family: More than three times a week    Frequency of Social Gatherings with Friends and Family: More than three times a week    Attends Religious Services: Never    Database administrator or Organizations: Yes    Attends Engineer, structural: More than 4 times per year    Marital Status: Married  Intimate Partner Violence: Not At Risk (01/26/2023)   Humiliation, Afraid, Rape, and Kick questionnaire    Fear of Current or Ex-Partner: No    Emotionally Abused: No    Physically Abused: No    Sexually Abused: No    Family History  Problem Relation Age of Onset   Heart failure Mother    Hypertension Mother    Hypothyroidism Mother    Hodgkin's lymphoma Sister    Hypothyroidism Maternal Grandmother    Colon cancer Neg Hx    Stomach cancer Neg Hx    Rectal cancer Neg Hx     Current Outpatient Medications on File Prior to Visit  Medication Sig Dispense Refill   albuterol (VENTOLIN HFA)  108 (90 Base) MCG/ACT inhaler 1-2 puff as needed Inhalation q 4-6 hours prn cough/wheeze for 90 days     atenolol  (TENORMIN ) 25 MG tablet TAKE 1 TABLET BY MOUTH EVERY DAY 90 tablet 3   Azelastine  HCl 137 MCG/SPRAY SOLN SPRAY 2 SPRAYS INTO BOTH NOSTRILS 2 (TWO) TIMES DAILY AS DIRECTED 90 mL 1   BREO ELLIPTA 200-25 MCG/ACT AEPB 1 puff Inhalation Once a day for 30 days     chlorthalidone  (HYGROTON ) 25 MG tablet TAKE 1/2 TABLET BY MOUTH EVERY DAY 45 tablet 0   EPINEPHrine 0.3 mg/0.3 mL IJ SOAJ injection one autoinjector Injection prn anaphylaxis, may repeat dose x1 for 30 days (Patient not taking: Reported on 02/29/2024)     fluticasone (FLONASE) 50 MCG/ACT nasal spray 1-2 sprays each nostril Nasally Once a day for 30 days (Patient not taking: Reported on 02/29/2024)     levocetirizine (XYZAL) 5 MG tablet 1 tablet in the evening Orally Once a day for 30 days     levothyroxine  (SYNTHROID ) 75 MCG tablet TAKE 1 TABLET BY MOUTH EVERY DAY 90 tablet 2   montelukast (SINGULAIR) 10 MG tablet 1 tablet Orally Once a day for 30 days     sildenafil  (VIAGRA ) 100 MG tablet Take 1 tablet (100 mg total) by mouth daily as needed for erectile dysfunction. 90 tablet 1   No current facility-administered medications on file prior to visit.    No Known Allergies     Physical Exam Vitals requested from patient and listed below if patient had equipment and was able to obtain at home for this virtual visit: There were no vitals filed for this visit. Estimated body mass index is 33 kg/m as calculated from the following:   Height as of 02/29/24: 5\' 10"  (1.778 m).   Weight as of 02/29/24: 230 lb (104.3 kg).  EKG (optional): deferred due to virtual visit  GENERAL: alert, oriented, no acute distress detected; full vision exam deferred due to pandemic and/or virtual encounter  PSYCH/NEURO: pleasant and cooperative, no obvious depression or anxiety, speech and thought processing grossly intact, Cognitive function grossly  intact        04/03/2024   11:34 AM 01/26/2023    9:28 AM 01/25/2022    1:47 PM 01/16/2021   10:04 AM 07/09/2020    8:30 AM  Depression screen PHQ 2/9  Decreased Interest 0 0 0 0 0  Down, Depressed, Hopeless 0 0 0 0 0  PHQ - 2 Score 0 0 0 0 0       09/24/2022    7:59 AM 01/26/2023    9:29 AM 08/09/2023    7:01 PM 04/03/2024   10:54 AM 04/03/2024   11:34 AM  Fall Risk  Falls in the past year? 0 0 0 0  0  Was there an injury with Fall? 0 0  0 0  Fall Risk Category Calculator 0 0  0  0  Fall Risk Category (Retired) Low      (RETIRED) Patient Fall Risk Level Low fall risk      Patient at Risk for Falls Due to No Fall Risks No Fall Risks     Fall risk Follow up Falls evaluation completed Falls prevention discussed        Patient-reported     SUMMARY AND PLAN:  Encounter for Medicare annual wellness exam   Discussed applicable health maintenance/preventive health measures and advised and referred or ordered per patient preferences: -discussed vaccines due recs/risks/advised can get at the pharmacy Health Maintenance  Topic Date Due   COVID-19 Vaccine (5 - 2024-25 season) 08/07/2023   DTaP/Tdap/Td (3 - Td or Tdap) 11/23/2023   INFLUENZA VACCINE  07/06/2024   Medicare Annual Wellness (AWV)  04/03/2025   Colonoscopy  03/01/2027   Pneumonia Vaccine 1+ Years old  Completed   Hepatitis C Screening  Completed   Zoster Vaccines- Shingrix  Completed   HPV VACCINES  Aged Out   Meningococcal B Vaccine  Aged Out     Education and counseling on the following was provided based on the above review of health and a plan/checklist for the patient, along with additional information discussed, was provided for the patient in the patient instructions :    -Advised and counseled on a healthy lifestyle  -Reviewed patient's current diet. Advised and counseled on a whole foods based healthy diet. A summary of a healthy diet was provided in the Patient Instructions.  -reviewed patient's current  physical activity level and discussed exercise guidelines for adults. Discussed community resources and ideas for safe exercise at home to assist in meeting exercise guideline recommendations in a safe and healthy way.  -Advise yearly dental visits at minimum and regular eye exams   Follow up: see patient instructions   Patient Instructions  I really enjoyed getting to talk with you today! I am available on Tuesdays and Thursdays for virtual visits if you have any questions or concerns, or if I can be of any further assistance.   CHECKLIST FROM ANNUAL WELLNESS VISIT:  -Follow up (please call to schedule if not scheduled after visit):   -yearly for annual wellness visit with primary care office  Here is a list of your preventive care/health maintenance measures and the plan for each if any are due:  PLAN For any measures below that may be due:  -can get the vaccines at the pharmacy, please let us  know if you do so that we can update your record  Health Maintenance  Topic Date Due   COVID-19 Vaccine (5 - 2024-25 season) 08/07/2023   DTaP/Tdap/Td (3 - Td or Tdap) 11/23/2023   INFLUENZA VACCINE  07/06/2024   Medicare Annual Wellness (AWV)  04/03/2025   Colonoscopy  03/01/2027   Pneumonia Vaccine 67+ Years old  Completed   Hepatitis C Screening  Completed   Zoster Vaccines- Shingrix  Completed   HPV VACCINES  Aged Out   Meningococcal B Vaccine  Aged Out    -See a dentist at least yearly  -Get your eyes checked and then per your eye specialist's recommendations  -Other issues addressed today:   -I have included below further information regarding a healthy whole foods based diet, physical activity guidelines for adults, stress management and opportunities for social connections. I hope you find this information useful.    -----------------------------------------------------------------------------------------------------------------------------------------------------------------------------------------------------------------------------------------------------------  NUTRITION: -eat real food: lots of colorful vegetables (half the plate) and fruits -5-7 servings of vegetables and fruits per day (fresh or steamed is best), exp. 2 servings of vegetables with lunch and dinner and 2 servings of fruit per day. Berries and greens such as kale and collards are great choices.  -consume on a regular basis:  fresh fruits, fresh veggies, fish, nuts, seeds, healthy oils (such as olive oil, avocado oil), whole grains (make sure for bread/pasta/crackers/etc., that the first ingredient on label contains the word "whole"), legumes. -can eat small amounts of dairy and lean meat (no larger than the palm of your hand), but avoid processed meats such as ham, bacon, lunch meat, etc. -drink water -try to avoid fast food and pre-packaged foods, processed meat, ultra processed foods/beverages (donuts, candy, etc.) -most experts advise limiting sodium to < 2300mg  per day, should limit further is any chronic conditions such as high blood pressure, heart disease, diabetes, etc. The American Heart Association advised that < 1500mg  is is ideal -try to avoid foods/beverages that contain any ingredients with names you do not recognize  -try to avoid foods/beverages  with added sugar or sweeteners/sweets  -try to avoid sweet drinks (including diet drinks): soda, juice, Gatorade, sweet tea, power drinks, diet drinks -try to avoid white rice, white bread, pasta (unless whole grain)  EXERCISE GUIDELINES FOR ADULTS: -if you wish to increase your physical activity, do so gradually and with the approval of your doctor -STOP and seek medical care immediately if you have any chest pain, chest discomfort or trouble breathing when starting or  increasing exercise  -move and stretch your body, legs, feet and arms when sitting for long periods -Physical activity guidelines for optimal health in adults: -get at least 150 minutes per week of moderate exercise (can talk, but not sing); this is about 20-30 minutes of sustained activity 5-7 days per week or two 10-15 minute episodes of sustained activity 5-7 days per week -do some muscle building/resistance training/strength training at least 2 days per week  -balance exercises 3+ days per week:   Stand somewhere where you have something sturdy to hold onto if you lose balance    1) lift up on toes, then back down, start with 5x per day and work up to 20x   2) stand and lift one leg straight out to the side so that foot is a few inches of the floor, start with 5x each side and work up to 20x each side   3) stand on one foot, start with 5 seconds each side and work up to 20 seconds on each side  If you need ideas or help with getting more active:  -Silver sneakers https://tools.silversneakers.com  -Walk with a Doc: http://www.duncan-williams.com/  -try to include resistance (weight lifting/strength building) and balance exercises twice per week: or the following link for ideas: http://castillo-powell.com/  BuyDucts.dk  STRESS MANAGEMENT: -can try meditating, or just sitting quietly with deep breathing while intentionally relaxing all parts of your body for 5 minutes daily -if you need further help with stress, anxiety or depression please follow up with your primary doctor or contact the wonderful folks at [facility]: [phone]  SOCIAL CONNECTIONS: -options in [place] if you wish to engage in more social and exercise related activities:  -Silver sneakers https://tools.silversneakers.com  -Walk with a Doc: http://www.duncan-williams.com/  -Check out the [facility] Active Adults 50+  section on the [place] of [employer] (hiking clubs, book clubs, cards and games, chess, exercise classes,  aquatic classes and much more) - see the website for details: https://www.[place].gov/departments/parks-recreation/active-adults50  -YouTube has lots of exercise videos for different ages and abilities as well  -[facility] (a variety of indoor and outdoor inperson activities for adults). 9895938507. [address]  -Virtual Online Classes (a variety of topics): see seniorplanet.org or call ([phone]) [phone]  -consider volunteering at a school, hospice center, church, senior center or elsewhere            Maurie Southern, DO

## 2024-04-03 NOTE — Progress Notes (Signed)
 Dear Dr. Annemarie Barry, Here is my assessment for our mutual patient, Edward Miles. Thank you for allowing me the opportunity to care for your patient. Please do not hesitate to contact me should you have any other questions. Sincerely, Dr. Milon Aloe  Otolaryngology Clinic Note  HISTORY: Edward Miles is a 78 y.o. male kindly referred by Dr. Nafziger for evaluation of chronic sinusitis..   Initial visit (03/2024): He reports feeling completely "clogged up" and post nasal drip. Much more severe on left, compared to right. Also reports discolored drainage from nose (yellow). Some cough but no dental pain. He has had multiple infections over past year (Oct, August, January). Normally does have 2-3 sinus infections a year (ongoing chronic issues since he was a child) -- more so in fall than spring. Last for weeks, given antibiotics, but no steroids for them. Did not seem to help much. He is on zyrtec, flonase, singulair, astelin  and that seems to help some. He is not currently on saline irrigations. Does have baseline nasal congestion which alternates.  He is not having significant issues currently. No pain/pressure. Significant symptoms and it is "debilitating" when he does have an infection. Allergy testing has been done - allergic to weeds, grasses, trees. He is currently on AIT.  CT was also done at end of one of his infections, did have modest symptoms at that point. No previous sinonasal surgery.  He is currently using astelin , flonase, singulair.  GLP-1: no AP/AC: no  Tobacco: no  PMHx: HTN, Hypothyroidism  RADIOGRAPHIC EVALUATION AND INDEPENDENT REVIEW OF OTHER RECORDS:: Dr. Almeda Jacobs ([facility]) notes 02/27/2024: noted rhinitis on singulair and zyrtec; on AIT; currently on zyrtec, flonase, singulair, astelin , breo, and saline irrigations; Has chronic sinusitis, referred to [facility] [place] (12/20/2023 and 01/26/2024): noted recurrent sinusitis - congestion, headaches, sinus  pressure; Rx: Augmentin , sinus CT; On sinus CT, noted left sphenoid sinusitis, prior on augmentin  - ref to ENT Lab: CBC 02/01/2024: WBC 4.4, Hgb 15.2, Plt 170, Eos 300; CMP 02/01/2024: BUN/Cr 19/0.9 CT Sinus 01/09/2024 independently interpreted: right septal deviation, generally chronic pansinusitis with significant b/l ethmoid disease, left sphenoid opacification and bilateral frontal opacification; some osteitic bone as well, mesenteried anterior ethmoid arteries, carotid and optic covered; type 4 cell right frontal?; small dehiscence left sphenoid superiorly - some dual density - query fungal elements?   Past Medical History:  Diagnosis Date   Colon polyps    Diverticulosis    ED (erectile dysfunction)    Hypertension    Hypothyroidism    Obese    Past Surgical History:  Procedure Laterality Date   CATARACT EXTRACTION, BILATERAL Bilateral    CHOLECYSTECTOMY     Family History  Problem Relation Age of Onset   Heart failure Mother    Hypertension Mother    Hypothyroidism Mother    Hodgkin's lymphoma Sister    Hypothyroidism Maternal Grandmother    Colon cancer Neg Hx    Stomach cancer Neg Hx    Rectal cancer Neg Hx    Social History   Tobacco Use   Smoking status: Never    Passive exposure: Never   Smokeless tobacco: Never  Substance Use Topics   Alcohol use: No   No Known Allergies Current Outpatient Medications  Medication Sig Dispense Refill   albuterol (VENTOLIN HFA) 108 (90 Base) MCG/ACT inhaler 1-2 puff as needed Inhalation q 4-6 hours prn cough/wheeze for 90 days     atenolol  (TENORMIN ) 25 MG tablet TAKE 1 TABLET BY MOUTH EVERY DAY 90  tablet 3   Azelastine  HCl 137 MCG/SPRAY SOLN SPRAY 2 SPRAYS INTO BOTH NOSTRILS 2 (TWO) TIMES DAILY AS DIRECTED 90 mL 1   BREO ELLIPTA 200-25 MCG/ACT AEPB 1 puff Inhalation Once a day for 30 days     chlorthalidone  (HYGROTON ) 25 MG tablet TAKE 1/2 TABLET BY MOUTH EVERY DAY 45 tablet 0   EPINEPHrine 0.3 mg/0.3 mL IJ SOAJ injection       fluticasone (FLONASE) 50 MCG/ACT nasal spray      levocetirizine (XYZAL) 5 MG tablet 1 tablet in the evening Orally Once a day for 30 days     levothyroxine  (SYNTHROID ) 75 MCG tablet TAKE 1 TABLET BY MOUTH EVERY DAY 90 tablet 2   montelukast (SINGULAIR) 10 MG tablet 1 tablet Orally Once a day for 30 days     sildenafil  (VIAGRA ) 100 MG tablet Take 1 tablet (100 mg total) by mouth daily as needed for erectile dysfunction. 90 tablet 1   No current facility-administered medications for this visit.   BP (!) 156/85 (BP Location: Left Arm, Patient Position: Sitting, Cuff Size: Large)   Pulse 62   Ht 5\' 11"  (1.803 m)   Wt 235 lb (106.6 kg)   SpO2 96%   BMI 32.78 kg/m   PHYSICAL EXAM:  BP (!) 156/85 (BP Location: Left Arm, Patient Position: Sitting, Cuff Size: Large)   Pulse 62   Ht 5\' 11"  (1.803 m)   Wt 235 lb (106.6 kg)   SpO2 96%   BMI 32.78 kg/m    Salient findings:  CN II-XII intact Bilateral EAC clear and TM intact with well pneumatized middle ear spaces Nose: Anterior rhinoscopy reveals septal deviation right which is significant, left > right inferior turbinate hypertrophy.  Nasal endoscopy was indicated to better evaluate the nose and paranasal sinuses, given the patient's history and exam findings, and is detailed below. No lesions of oral cavity/oropharynx; dentition fair No obviously palpable neck masses/lymphadenopathy/thyromegaly No respiratory distress or stridor   PROCEDURE:  Prior to initiating any procedures, risks/benefits/alternatives were explained to the patient and verbal consent obtained. Diagnostic Nasal Endoscopy Pre-procedure diagnosis: Concern for chronic sinusitis Post-procedure diagnosis: same Indication: See pre-procedure diagnosis and physical exam above Complications: None apparent EBL: 0 mL Anesthesia: Lidocaine 4% and topical decongestant was topically sprayed in each nasal cavity  Description of Procedure:  Patient was identified. A rigid 30  degree endoscope was utilized to evaluate the sinonasal cavities, mucosa, sinus ostia and turbinates and septum.  Overall, signs of mucosal inflammation are mild but noted - global mucosal edema.  Also noted are right septal deviation.  No mucopurulence, polyps, or masses noted.  No CSF noted Right Middle meatus: clear Right SE Recess: clear Left MM: clear Left SE Recess: modest mucoid secretions Photodocumentation was obtained.  CPT CODE -- 31231 - Mod 25   ASSESSMENT:  78 y.o. with:  1. Chronic pansinusitis   2. Nasal congestion   3. Hypertrophy of both inferior nasal turbinates   4. Nasal obstruction   5. Nasal septal deviation   6. Allergic rhinitis, unspecified seasonality, unspecified trigger    Noted chronic sinonasal issues with multiple infections per year. Query mycetoma left sphenoid with some dehiscence but no evidence of CSF Leak on exam or endoscopy  We've discussed issues and options today.  We reviewed the nasal endoscopy images together.  The risks, benefits and alternatives were discussed and questions answered.   We discussed the goals of sinus surgery given what appear to be chronic changes and  quality of life disruption, and expectations for postoperative management. We discussed R/B/A including pain, infection, bleeding (~5% risk of operative visit for control), persistent symptoms, need for revision surgery, and other risks including damage to the eye and loss of vision, and injury to skull base with risk of CSF leak and additional intracranial complications, anesthetic complications, among others.  Patient understands and is ready to proceed.  We also discussed the goals of septoplasty and turbinate reduction, and expectations for postoperative management. Will plan to leave splints in place, and removal was also discussed. We also discussed nasal obstruction post-operatively until splints in place and pain management.  We discussed R/B/A including pain,  infection, bleeding (~5% risk of operative visit for control), persistent symptoms, need for revision surgery, and other risks including damage to surrounding structures, septal perforation, and injury to skull base with risk of CSF leak and additional intracranial complications, anesthetic complications, among others.  We discussed use of nasal saline spray and nasal saline irrigations post-operatively. We also discussed use of intranasal steroid post-operatively.  Patient understands. He reports that given how bothersome this is, he is leaning towards undergoing the procedure but will discuss with his wife.  Will plan to call him in 1 week. Otherwise f/u 3 months. If decides to proceed, will need periop abx/steroids and also a fusion sinus CT  Continue flonase and astelin  in interim and management per allergy.  See below regarding exact medications prescribed this encounter including dosages and route: No orders of the defined types were placed in this encounter.    Thank you for allowing me the opportunity to care for your patient. Please do not hesitate to contact me should you have any other questions.  Sincerely, Milon Aloe, MD Otolaryngologist (ENT), [facility] Phone: [phone] Fax: [phone]  I have personally spent 50 minutes involved in face-to-face and non-face-to-face activities for this patient on the day of the visit.  Professional time spent excludes any procedures performed but includes the following activities, in addition to those noted in the documentation: preparing to see the patient (review of outside documentation and results), performing a medically appropriate examination, counseling, documenting in the electronic health record, independently interpreting results (CT)   04/03/2024, 4:21 PM

## 2024-04-09 DIAGNOSIS — J3081 Allergic rhinitis due to animal (cat) (dog) hair and dander: Secondary | ICD-10-CM | POA: Diagnosis not present

## 2024-04-09 DIAGNOSIS — J301 Allergic rhinitis due to pollen: Secondary | ICD-10-CM | POA: Diagnosis not present

## 2024-04-09 DIAGNOSIS — J3089 Other allergic rhinitis: Secondary | ICD-10-CM | POA: Diagnosis not present

## 2024-04-10 ENCOUNTER — Telehealth (INDEPENDENT_AMBULATORY_CARE_PROVIDER_SITE_OTHER): Payer: Self-pay | Admitting: Otolaryngology

## 2024-04-10 NOTE — Telephone Encounter (Signed)
 Talked to patient. He wishes to hold off on surgery. Will see him back in July as scheduled, sooner if any issues Evelina Hippo

## 2024-04-16 ENCOUNTER — Encounter (INDEPENDENT_AMBULATORY_CARE_PROVIDER_SITE_OTHER): Payer: Self-pay

## 2024-04-16 ENCOUNTER — Telehealth (INDEPENDENT_AMBULATORY_CARE_PROVIDER_SITE_OTHER): Payer: Self-pay | Admitting: Otolaryngology

## 2024-04-16 DIAGNOSIS — J3089 Other allergic rhinitis: Secondary | ICD-10-CM | POA: Diagnosis not present

## 2024-04-16 DIAGNOSIS — J3081 Allergic rhinitis due to animal (cat) (dog) hair and dander: Secondary | ICD-10-CM | POA: Diagnosis not present

## 2024-04-16 DIAGNOSIS — J301 Allergic rhinitis due to pollen: Secondary | ICD-10-CM | POA: Diagnosis not present

## 2024-04-16 NOTE — Telephone Encounter (Signed)
 Patient unavailable to reschedule when called. Sent MyChart msg requesting patient call our office to r/s because Dr. Lydia Sams will be out of the office on 07/04/2024.

## 2024-04-23 DIAGNOSIS — J301 Allergic rhinitis due to pollen: Secondary | ICD-10-CM | POA: Diagnosis not present

## 2024-04-23 DIAGNOSIS — J3081 Allergic rhinitis due to animal (cat) (dog) hair and dander: Secondary | ICD-10-CM | POA: Diagnosis not present

## 2024-04-23 DIAGNOSIS — J3089 Other allergic rhinitis: Secondary | ICD-10-CM | POA: Diagnosis not present

## 2024-05-02 DIAGNOSIS — J3089 Other allergic rhinitis: Secondary | ICD-10-CM | POA: Diagnosis not present

## 2024-05-02 DIAGNOSIS — J3081 Allergic rhinitis due to animal (cat) (dog) hair and dander: Secondary | ICD-10-CM | POA: Diagnosis not present

## 2024-05-02 DIAGNOSIS — J301 Allergic rhinitis due to pollen: Secondary | ICD-10-CM | POA: Diagnosis not present

## 2024-05-09 DIAGNOSIS — J3089 Other allergic rhinitis: Secondary | ICD-10-CM | POA: Diagnosis not present

## 2024-05-09 DIAGNOSIS — J301 Allergic rhinitis due to pollen: Secondary | ICD-10-CM | POA: Diagnosis not present

## 2024-05-09 DIAGNOSIS — J3081 Allergic rhinitis due to animal (cat) (dog) hair and dander: Secondary | ICD-10-CM | POA: Diagnosis not present

## 2024-05-16 DIAGNOSIS — J3089 Other allergic rhinitis: Secondary | ICD-10-CM | POA: Diagnosis not present

## 2024-05-16 DIAGNOSIS — J301 Allergic rhinitis due to pollen: Secondary | ICD-10-CM | POA: Diagnosis not present

## 2024-05-16 DIAGNOSIS — J3081 Allergic rhinitis due to animal (cat) (dog) hair and dander: Secondary | ICD-10-CM | POA: Diagnosis not present

## 2024-05-29 DIAGNOSIS — J3081 Allergic rhinitis due to animal (cat) (dog) hair and dander: Secondary | ICD-10-CM | POA: Diagnosis not present

## 2024-05-29 DIAGNOSIS — J301 Allergic rhinitis due to pollen: Secondary | ICD-10-CM | POA: Diagnosis not present

## 2024-05-29 DIAGNOSIS — J3089 Other allergic rhinitis: Secondary | ICD-10-CM | POA: Diagnosis not present

## 2024-06-06 ENCOUNTER — Institutional Professional Consult (permissible substitution) (INDEPENDENT_AMBULATORY_CARE_PROVIDER_SITE_OTHER)

## 2024-06-06 DIAGNOSIS — J301 Allergic rhinitis due to pollen: Secondary | ICD-10-CM | POA: Diagnosis not present

## 2024-06-06 DIAGNOSIS — J3089 Other allergic rhinitis: Secondary | ICD-10-CM | POA: Diagnosis not present

## 2024-06-06 DIAGNOSIS — J3081 Allergic rhinitis due to animal (cat) (dog) hair and dander: Secondary | ICD-10-CM | POA: Diagnosis not present

## 2024-06-20 DIAGNOSIS — J3081 Allergic rhinitis due to animal (cat) (dog) hair and dander: Secondary | ICD-10-CM | POA: Diagnosis not present

## 2024-06-20 DIAGNOSIS — J301 Allergic rhinitis due to pollen: Secondary | ICD-10-CM | POA: Diagnosis not present

## 2024-06-20 DIAGNOSIS — J3089 Other allergic rhinitis: Secondary | ICD-10-CM | POA: Diagnosis not present

## 2024-06-27 DIAGNOSIS — J449 Chronic obstructive pulmonary disease, unspecified: Secondary | ICD-10-CM | POA: Diagnosis not present

## 2024-06-27 DIAGNOSIS — Z8249 Family history of ischemic heart disease and other diseases of the circulatory system: Secondary | ICD-10-CM | POA: Diagnosis not present

## 2024-06-27 DIAGNOSIS — J3089 Other allergic rhinitis: Secondary | ICD-10-CM | POA: Diagnosis not present

## 2024-06-27 DIAGNOSIS — M545 Low back pain, unspecified: Secondary | ICD-10-CM | POA: Diagnosis not present

## 2024-06-27 DIAGNOSIS — N400 Enlarged prostate without lower urinary tract symptoms: Secondary | ICD-10-CM | POA: Diagnosis not present

## 2024-06-27 DIAGNOSIS — Z809 Family history of malignant neoplasm, unspecified: Secondary | ICD-10-CM | POA: Diagnosis not present

## 2024-06-27 DIAGNOSIS — I10 Essential (primary) hypertension: Secondary | ICD-10-CM | POA: Diagnosis not present

## 2024-06-27 DIAGNOSIS — J3081 Allergic rhinitis due to animal (cat) (dog) hair and dander: Secondary | ICD-10-CM | POA: Diagnosis not present

## 2024-06-27 DIAGNOSIS — E669 Obesity, unspecified: Secondary | ICD-10-CM | POA: Diagnosis not present

## 2024-06-27 DIAGNOSIS — J301 Allergic rhinitis due to pollen: Secondary | ICD-10-CM | POA: Diagnosis not present

## 2024-06-27 DIAGNOSIS — Z008 Encounter for other general examination: Secondary | ICD-10-CM | POA: Diagnosis not present

## 2024-06-27 DIAGNOSIS — N529 Male erectile dysfunction, unspecified: Secondary | ICD-10-CM | POA: Diagnosis not present

## 2024-06-27 DIAGNOSIS — Z7951 Long term (current) use of inhaled steroids: Secondary | ICD-10-CM | POA: Diagnosis not present

## 2024-06-27 DIAGNOSIS — Z85828 Personal history of other malignant neoplasm of skin: Secondary | ICD-10-CM | POA: Diagnosis not present

## 2024-06-27 DIAGNOSIS — M1990 Unspecified osteoarthritis, unspecified site: Secondary | ICD-10-CM | POA: Diagnosis not present

## 2024-07-04 ENCOUNTER — Ambulatory Visit (INDEPENDENT_AMBULATORY_CARE_PROVIDER_SITE_OTHER): Admitting: Otolaryngology

## 2024-07-05 DIAGNOSIS — J3081 Allergic rhinitis due to animal (cat) (dog) hair and dander: Secondary | ICD-10-CM | POA: Diagnosis not present

## 2024-07-05 DIAGNOSIS — J3089 Other allergic rhinitis: Secondary | ICD-10-CM | POA: Diagnosis not present

## 2024-07-05 DIAGNOSIS — J301 Allergic rhinitis due to pollen: Secondary | ICD-10-CM | POA: Diagnosis not present

## 2024-07-11 DIAGNOSIS — J301 Allergic rhinitis due to pollen: Secondary | ICD-10-CM | POA: Diagnosis not present

## 2024-07-11 DIAGNOSIS — J3089 Other allergic rhinitis: Secondary | ICD-10-CM | POA: Diagnosis not present

## 2024-07-11 DIAGNOSIS — J3081 Allergic rhinitis due to animal (cat) (dog) hair and dander: Secondary | ICD-10-CM | POA: Diagnosis not present

## 2024-07-18 DIAGNOSIS — J301 Allergic rhinitis due to pollen: Secondary | ICD-10-CM | POA: Diagnosis not present

## 2024-07-18 DIAGNOSIS — J3089 Other allergic rhinitis: Secondary | ICD-10-CM | POA: Diagnosis not present

## 2024-07-18 DIAGNOSIS — J3081 Allergic rhinitis due to animal (cat) (dog) hair and dander: Secondary | ICD-10-CM | POA: Diagnosis not present

## 2024-07-25 DIAGNOSIS — J3081 Allergic rhinitis due to animal (cat) (dog) hair and dander: Secondary | ICD-10-CM | POA: Diagnosis not present

## 2024-07-25 DIAGNOSIS — J3089 Other allergic rhinitis: Secondary | ICD-10-CM | POA: Diagnosis not present

## 2024-07-25 DIAGNOSIS — J301 Allergic rhinitis due to pollen: Secondary | ICD-10-CM | POA: Diagnosis not present

## 2024-07-27 ENCOUNTER — Other Ambulatory Visit: Payer: Self-pay | Admitting: Adult Health

## 2024-08-01 DIAGNOSIS — L814 Other melanin hyperpigmentation: Secondary | ICD-10-CM | POA: Diagnosis not present

## 2024-08-01 DIAGNOSIS — L821 Other seborrheic keratosis: Secondary | ICD-10-CM | POA: Diagnosis not present

## 2024-08-01 DIAGNOSIS — J301 Allergic rhinitis due to pollen: Secondary | ICD-10-CM | POA: Diagnosis not present

## 2024-08-01 DIAGNOSIS — J3081 Allergic rhinitis due to animal (cat) (dog) hair and dander: Secondary | ICD-10-CM | POA: Diagnosis not present

## 2024-08-01 DIAGNOSIS — Z85828 Personal history of other malignant neoplasm of skin: Secondary | ICD-10-CM | POA: Diagnosis not present

## 2024-08-01 DIAGNOSIS — L738 Other specified follicular disorders: Secondary | ICD-10-CM | POA: Diagnosis not present

## 2024-08-01 DIAGNOSIS — J3089 Other allergic rhinitis: Secondary | ICD-10-CM | POA: Diagnosis not present

## 2024-08-01 DIAGNOSIS — D1801 Hemangioma of skin and subcutaneous tissue: Secondary | ICD-10-CM | POA: Diagnosis not present

## 2024-08-08 ENCOUNTER — Ambulatory Visit (INDEPENDENT_AMBULATORY_CARE_PROVIDER_SITE_OTHER): Admitting: Otolaryngology

## 2024-08-08 ENCOUNTER — Encounter (INDEPENDENT_AMBULATORY_CARE_PROVIDER_SITE_OTHER): Payer: Self-pay | Admitting: Otolaryngology

## 2024-08-08 VITALS — BP 127/78 | HR 59 | Ht 71.0 in | Wt 232.0 lb

## 2024-08-08 DIAGNOSIS — J342 Deviated nasal septum: Secondary | ICD-10-CM

## 2024-08-08 DIAGNOSIS — J324 Chronic pansinusitis: Secondary | ICD-10-CM | POA: Diagnosis not present

## 2024-08-08 DIAGNOSIS — J3489 Other specified disorders of nose and nasal sinuses: Secondary | ICD-10-CM

## 2024-08-08 DIAGNOSIS — J343 Hypertrophy of nasal turbinates: Secondary | ICD-10-CM | POA: Diagnosis not present

## 2024-08-08 DIAGNOSIS — J309 Allergic rhinitis, unspecified: Secondary | ICD-10-CM | POA: Diagnosis not present

## 2024-08-08 DIAGNOSIS — R0981 Nasal congestion: Secondary | ICD-10-CM

## 2024-08-08 MED ORDER — PREDNISONE 20 MG PO TABS: 20.0000 mg | ORAL_TABLET | Freq: Every day | ORAL | 0 refills | Status: AC

## 2024-08-08 MED ORDER — AMOXICILLIN-POT CLAVULANATE 875-125 MG PO TABS: 1.0000 | ORAL_TABLET | Freq: Two times a day (BID) | ORAL | 0 refills | Status: AC

## 2024-08-08 NOTE — Patient Instructions (Signed)
 Take Prednisone  by mouth (PO) 20mg  xfor 7 days - take first thing in morning. Risks discussed Take Augmentin  875 mg by mouth (PO) twice daily for 10 days; take with food, take probiotic or yogurt with it  Continue nasal sprays  Aureliano Med Nasal Saline Rinse - use daily - start nasal saline rinses with NeilMed Bottle available over the counter    Nasal Saline Irrigation instructions: If you choose to make your own salt water solution, You will need: Salt (kosher, canning, or pickling salt) Baking soda Nasal irrigation bottle (i.e. Aureliano Med Sinus Rinse) Measuring spoon ( teaspoon) Distilled / boiled water   Mix solution Mix 1 teaspoon of salt, 1/2 teaspoon of baking soda and 1 cup of water into irrigation bottle ** May use saline packet instead of homemade recipe for this step if you prefer If medicine was prescribed to be mixed with solution, place this into bottle Examples 2 inches of 2% mupirocin ointment Budesonide solution Position your head: Lean over sink (about 45 degrees) Rotate head (about 45 degrees) so that one nostril is above the other Irrigate Insert tip of irrigation bottle into upper nostril so it forms a comfortable seal Irrigate while breathing through your mouth May remove the straw from the bottle in order to irrigate the entire solution (important if medicine was added) Exhale through nose when finished and blow nose as necessary  Repeat on opposite side with other 1/2 of solution (120 mL) or remake solution if all 240 mL was used on first side Wash irrigation bottle regularly, replace every 3 months

## 2024-08-08 NOTE — Progress Notes (Signed)
 Dear Dr. Merna, Here is my assessment for our mutual patient, Atharva Mirsky. Thank you for allowing me the opportunity to care for your patient. Please do not hesitate to contact me should you have any other questions. Sincerely, Dr. Eldora Blanch  Otolaryngology Clinic Note  HISTORY: Edward Miles is a 78 y.o. male kindly referred by Dr. Nafziger for evaluation of chronic sinusitis..   Initial visit (03/2024): He reports feeling completely clogged up and post nasal drip. Much more severe on left, compared to right. Also reports discolored drainage from nose (yellow). Some cough but no dental pain. He has had multiple infections over past year (Oct, August, January). Normally does have 2-3 sinus infections a year (ongoing chronic issues since he was a child) -- more so in fall than spring. Last for weeks, given antibiotics, but no steroids for them. Did not seem to help much. He is on zyrtec, flonase, singulair, astelin  and that seems to help some. He is not currently on saline irrigations. Does have baseline nasal congestion which alternates.  He is not having significant issues currently. No pain/pressure. Significant symptoms and it is debilitating when he does have an infection. Allergy testing has been done - allergic to weeds, grasses, trees. He is currently on AIT.  CT was also done at end of one of his infections, did have modest symptoms at that point. No previous sinonasal surgery.  He is currently using astelin , flonase, singulair.  --------------------------------------------------------- 08/08/2024 Seen in follow up. He reports he did well this summer and is on AIT, flonase, and astelin  but over the past week, he has had significant nasal congestion, discolored drainage, some b/l facial pressure (right > left) and now feels like he has had a sinus exacerbation. No baseline headaches.    GLP-1: no AP/AC: no  Tobacco: no  PMHx: HTN, Hypothyroidism  RADIOGRAPHIC  EVALUATION AND INDEPENDENT REVIEW OF OTHER RECORDS:: Dr. Frutoso ([facility]) notes 02/27/2024: noted rhinitis on singulair and zyrtec; on AIT; currently on zyrtec, flonase, singulair, astelin , breo, and saline irrigations; Has chronic sinusitis, referred to ENT [facility] (12/20/2023 and 01/26/2024): noted recurrent sinusitis - congestion, headaches, sinus pressure; Rx: Augmentin , sinus CT; On sinus CT, noted left sphenoid sinusitis, prior on augmentin  - ref to ENT Lab: CBC 02/01/2024: WBC 4.4, Hgb 15.2, Plt 170, Eos 300; CMP 02/01/2024: BUN/Cr 19/0.9 CT Sinus 01/09/2024 independently interpreted: right septal deviation, generally chronic pansinusitis with significant b/l ethmoid disease, left sphenoid opacification and bilateral frontal opacification; some osteitic bone as well, mesenteried anterior ethmoid arteries, carotid and optic covered; type 4 cell right frontal?; small dehiscence left sphenoid superiorly - some dual density - query fungal elements?   Past Medical History:  Diagnosis Date   Colon polyps    Diverticulosis    ED (erectile dysfunction)    Hypertension    Hypothyroidism    Obese    Past Surgical History:  Procedure Laterality Date   CATARACT EXTRACTION, BILATERAL Bilateral    CHOLECYSTECTOMY     Family History  Problem Relation Age of Onset   Heart failure Mother    Hypertension Mother    Hypothyroidism Mother    Hodgkin's lymphoma Sister    Hypothyroidism Maternal Grandmother    Colon cancer Neg Hx    Stomach cancer Neg Hx    Rectal cancer Neg Hx    Social History   Tobacco Use   Smoking status: Never    Passive exposure: Never   Smokeless tobacco: Never  Substance Use Topics  Alcohol use: No   No Known Allergies Current Outpatient Medications  Medication Sig Dispense Refill   albuterol (VENTOLIN HFA) 108 (90 Base) MCG/ACT inhaler 1-2 puff as needed Inhalation q 4-6 hours prn cough/wheeze for 90 days     amoxicillin -clavulanate (AUGMENTIN ) 875-125  MG tablet Take 1 tablet by mouth 2 (two) times daily. 20 tablet 0   atenolol  (TENORMIN ) 25 MG tablet TAKE 1 TABLET BY MOUTH EVERY DAY 90 tablet 3   Azelastine  HCl 137 MCG/SPRAY SOLN SPRAY 2 SPRAYS INTO BOTH NOSTRILS 2 (TWO) TIMES DAILY AS DIRECTED 90 mL 1   BREO ELLIPTA 200-25 MCG/ACT AEPB 1 puff Inhalation Once a day for 30 days     chlorthalidone  (HYGROTON ) 25 MG tablet TAKE 1/2 TABLET BY MOUTH EVERY DAY 45 tablet 0   EPINEPHrine 0.3 mg/0.3 mL IJ SOAJ injection      fluticasone (FLONASE) 50 MCG/ACT nasal spray      levocetirizine (XYZAL) 5 MG tablet 1 tablet in the evening Orally Once a day for 30 days     levothyroxine  (SYNTHROID ) 75 MCG tablet TAKE 1 TABLET BY MOUTH EVERY DAY 90 tablet 2   montelukast (SINGULAIR) 10 MG tablet 1 tablet Orally Once a day for 30 days     predniSONE  (DELTASONE ) 20 MG tablet Take 1 tablet (20 mg total) by mouth daily with breakfast. 7 tablet 0   sildenafil  (VIAGRA ) 100 MG tablet Take 1 tablet (100 mg total) by mouth daily as needed for erectile dysfunction. 90 tablet 1   No current facility-administered medications for this visit.   BP 127/78 (BP Location: Left Arm, Patient Position: Sitting, Cuff Size: Large)   Pulse (!) 59   Ht 5' 11 (1.803 m)   Wt 232 lb (105.2 kg)   SpO2 94%   BMI 32.36 kg/m   PHYSICAL EXAM:  BP 127/78 (BP Location: Left Arm, Patient Position: Sitting, Cuff Size: Large)   Pulse (!) 59   Ht 5' 11 (1.803 m)   Wt 232 lb (105.2 kg)   SpO2 94%   BMI 32.36 kg/m    Salient findings:  CN II-XII intact Nose: Anterior rhinoscopy reveals septal deviation right which is significant, with modest global mucosal edema; left > right inferior turbinate hypertrophy.  Nasal endoscopy was indicated to better evaluate the nose and paranasal sinuses, given the patient's history and exam findings, and is detailed below. No lesions of oral cavity/oropharynx; dentition fair No respiratory distress or stridor   PROCEDURE:  Prior to initiating any  procedures, risks/benefits/alternatives were explained to the patient and verbal consent obtained. Diagnostic Nasal Endoscopy Pre-procedure diagnosis: Concern for chronic sinusitis with exacerbation Post-procedure diagnosis: same Indication: See pre-procedure diagnosis and physical exam above Complications: None apparent EBL: 0 mL Anesthesia: Lidocaine 4% and topical decongestant was topically sprayed in each nasal cavity  Description of Procedure:  Patient was identified. A rigid 30 degree endoscope was utilized to evaluate the sinonasal cavities, mucosa, sinus ostia and turbinates and septum.  Overall, signs of mucosal inflammation are fairly significant - bilateral globally.  Also noted are right septal deviation.  No polyps, or masses noted.  No CSF noted Right Middle meatus: modest mucosal edema middle meatus, some mucoid secretions Right SE Recess: clear Left MM: modest mucosal edema with trace purulence Left SE Recess: modest mucoid secretions   CPT CODE -- 68768 - Mod 25   ASSESSMENT:  78 y.o. with:  1. Chronic pansinusitis   2. Nasal congestion   3. Hypertrophy of both inferior nasal  turbinates   4. Nasal obstruction   5. Allergic rhinitis, unspecified seasonality, unspecified trigger   6. Nasal septal deviation    Noted chronic sinonasal issues with multiple infections per year. Query mycetoma left sphenoid with some dehiscence but no evidence of CSF Leak on exam or endoscopy  He is now having an exacerbation despite medical management. Will Rx augmentin  and prednisone .  Given his exacerbations, we again discussed septo/FESS in light of his likely fungal ball and SB dehiscence. He reports he will think about it. We did discuss risks of observation  - Start augmentin  and prednisone  as below - Daily nasal rinses - Continue flonase and astelin  in interim and management per allergy. - f/u 1 month  See below regarding exact medications prescribed this encounter including  dosages and route: Meds ordered this encounter  Medications   amoxicillin -clavulanate (AUGMENTIN ) 875-125 MG tablet    Sig: Take 1 tablet by mouth 2 (two) times daily.    Dispense:  20 tablet    Refill:  0   predniSONE  (DELTASONE ) 20 MG tablet    Sig: Take 1 tablet (20 mg total) by mouth daily with breakfast.    Dispense:  7 tablet    Refill:  0     Thank you for allowing me the opportunity to care for your patient. Please do not hesitate to contact me should you have any other questions.  Sincerely, Eldora Blanch, MD Otolaryngologist (ENT), [facility] Phone: [phone] Fax: [phone]  MDM:  Level 4: 99214 Complexity/Problems addressed: mod - chronic problem with exacerbation Data complexity: low - Morbidity: mod  - Prescription Drug prescribed or managed: y     08/08/2024, 1:03 PM

## 2024-08-13 DIAGNOSIS — J3081 Allergic rhinitis due to animal (cat) (dog) hair and dander: Secondary | ICD-10-CM | POA: Diagnosis not present

## 2024-08-13 DIAGNOSIS — J301 Allergic rhinitis due to pollen: Secondary | ICD-10-CM | POA: Diagnosis not present

## 2024-08-13 DIAGNOSIS — J3089 Other allergic rhinitis: Secondary | ICD-10-CM | POA: Diagnosis not present

## 2024-09-03 DIAGNOSIS — J301 Allergic rhinitis due to pollen: Secondary | ICD-10-CM | POA: Diagnosis not present

## 2024-09-03 DIAGNOSIS — J3081 Allergic rhinitis due to animal (cat) (dog) hair and dander: Secondary | ICD-10-CM | POA: Diagnosis not present

## 2024-09-03 DIAGNOSIS — J3089 Other allergic rhinitis: Secondary | ICD-10-CM | POA: Diagnosis not present

## 2024-09-11 ENCOUNTER — Ambulatory Visit (INDEPENDENT_AMBULATORY_CARE_PROVIDER_SITE_OTHER): Admitting: Otolaryngology

## 2024-09-11 ENCOUNTER — Encounter (INDEPENDENT_AMBULATORY_CARE_PROVIDER_SITE_OTHER): Payer: Self-pay | Admitting: Otolaryngology

## 2024-09-11 VITALS — BP 142/81 | HR 60 | Ht 71.0 in | Wt 232.0 lb

## 2024-09-11 DIAGNOSIS — J324 Chronic pansinusitis: Secondary | ICD-10-CM | POA: Diagnosis not present

## 2024-09-11 DIAGNOSIS — J3089 Other allergic rhinitis: Secondary | ICD-10-CM | POA: Diagnosis not present

## 2024-09-11 DIAGNOSIS — J3081 Allergic rhinitis due to animal (cat) (dog) hair and dander: Secondary | ICD-10-CM | POA: Diagnosis not present

## 2024-09-11 DIAGNOSIS — J309 Allergic rhinitis, unspecified: Secondary | ICD-10-CM | POA: Diagnosis not present

## 2024-09-11 DIAGNOSIS — J3489 Other specified disorders of nose and nasal sinuses: Secondary | ICD-10-CM

## 2024-09-11 DIAGNOSIS — J342 Deviated nasal septum: Secondary | ICD-10-CM

## 2024-09-11 DIAGNOSIS — J301 Allergic rhinitis due to pollen: Secondary | ICD-10-CM | POA: Diagnosis not present

## 2024-09-11 DIAGNOSIS — R0981 Nasal congestion: Secondary | ICD-10-CM | POA: Diagnosis not present

## 2024-09-11 DIAGNOSIS — J343 Hypertrophy of nasal turbinates: Secondary | ICD-10-CM

## 2024-09-11 NOTE — Progress Notes (Signed)
 Dear Dr. Merna, Here is my assessment for our mutual patient, Edward Miles. Thank you for allowing me the opportunity to care for your patient. Please do not hesitate to contact me should you have any other questions. Sincerely, Dr. Eldora Blanch  Otolaryngology Clinic Note  HISTORY: Edward Miles is a 78 y.o. male kindly referred by Dr. Nafziger for evaluation of chronic sinusitis..   Initial visit (03/2024): He reports feeling completely clogged up and post nasal drip. Much more severe on left, compared to right. Also reports discolored drainage from nose (yellow). Some cough but no dental pain. He has had multiple infections over past year (Oct, August, January). Normally does have 2-3 sinus infections a year (ongoing chronic issues since he was a child) -- more so in fall than spring. Last for weeks, given antibiotics, but no steroids for them. Did not seem to help much. He is on zyrtec, flonase, singulair, astelin  and that seems to help some. He is not currently on saline irrigations. Does have baseline nasal congestion which alternates.  He is not having significant issues currently. No pain/pressure. Significant symptoms and it is debilitating when he does have an infection. Allergy testing has been done - allergic to weeds, grasses, trees. He is currently on AIT.  CT was also done at end of one of his infections, did have modest symptoms at that point. No previous sinonasal surgery.  He is currently using astelin , flonase, singulair.  --------------------------------------------------------- 08/08/2024 Seen in follow up. He reports he did well this summer and is on AIT, flonase, and astelin  but over the past week, he has had significant nasal congestion, discolored drainage, some b/l facial pressure (right > left) and now feels like he has had a sinus exacerbation. No baseline headaches.   --------------------------------------------------------- 09/11/2024 He had some  typical AR symptoms starting last week with some congestion, runny nose and some discolored drainage. Frontal pressure today but otherwise no symptoms. On flonase and astelin . Using rinses. Antibiotics and steroids helped significantly.  We again discussed possible FESS which he is leaning towards.   GLP-1: no AP/AC: no  Tobacco: no  PMHx: HTN, Hypothyroidism  RADIOGRAPHIC EVALUATION AND INDEPENDENT REVIEW OF OTHER RECORDS:: Dr. Frutoso ([facility]) notes 02/27/2024: noted rhinitis on singulair and zyrtec; on AIT; currently on zyrtec, flonase, singulair, astelin , breo, and saline irrigations; Has chronic sinusitis, referred to ENT Darleene Merna (12/20/2023 and 01/26/2024): noted recurrent sinusitis - congestion, headaches, sinus pressure; Rx: Augmentin , sinus CT; On sinus CT, noted left sphenoid sinusitis, prior on augmentin  - ref to ENT Lab: CBC 02/01/2024: WBC 4.4, Hgb 15.2, Plt 170, Eos 300; CMP 02/01/2024: BUN/Cr 19/0.9 CT Sinus 01/09/2024 independently interpreted: right septal deviation, generally chronic pansinusitis with significant b/l ethmoid disease, left sphenoid opacification and bilateral frontal opacification; some osteitic bone as well, mesenteried anterior ethmoid arteries, carotid and optic covered; type 4 cell right frontal?; small dehiscence left sphenoid superiorly - some dual density - query fungal elements?   Past Medical History:  Diagnosis Date   Colon polyps    Diverticulosis    ED (erectile dysfunction)    Hypertension    Hypothyroidism    Obese    Past Surgical History:  Procedure Laterality Date   CATARACT EXTRACTION, BILATERAL Bilateral    CHOLECYSTECTOMY     Family History  Problem Relation Age of Onset   Heart failure Mother    Hypertension Mother    Hypothyroidism Mother    Hodgkin's lymphoma Sister    Hypothyroidism Maternal Grandmother  Colon cancer Neg Hx    Stomach cancer Neg Hx    Rectal cancer Neg Hx    Social History   Tobacco Use    Smoking status: Never    Passive exposure: Never   Smokeless tobacco: Never  Substance Use Topics   Alcohol use: No   No Known Allergies Current Outpatient Medications  Medication Sig Dispense Refill   albuterol (VENTOLIN HFA) 108 (90 Base) MCG/ACT inhaler 1-2 puff as needed Inhalation q 4-6 hours prn cough/wheeze for 90 days     amoxicillin -clavulanate (AUGMENTIN ) 875-125 MG tablet Take 1 tablet by mouth 2 (two) times daily. 20 tablet 0   atenolol  (TENORMIN ) 25 MG tablet TAKE 1 TABLET BY MOUTH EVERY DAY 90 tablet 3   Azelastine  HCl 137 MCG/SPRAY SOLN SPRAY 2 SPRAYS INTO BOTH NOSTRILS 2 (TWO) TIMES DAILY AS DIRECTED 90 mL 1   BREO ELLIPTA 200-25 MCG/ACT AEPB 1 puff Inhalation Once a day for 30 days     chlorthalidone  (HYGROTON ) 25 MG tablet TAKE 1/2 TABLET BY MOUTH EVERY DAY 45 tablet 0   EPINEPHrine 0.3 mg/0.3 mL IJ SOAJ injection      fluticasone (FLONASE) 50 MCG/ACT nasal spray      levocetirizine (XYZAL) 5 MG tablet 1 tablet in the evening Orally Once a day for 30 days     levothyroxine  (SYNTHROID ) 75 MCG tablet TAKE 1 TABLET BY MOUTH EVERY DAY 90 tablet 2   montelukast (SINGULAIR) 10 MG tablet 1 tablet Orally Once a day for 30 days     predniSONE  (DELTASONE ) 20 MG tablet Take 1 tablet (20 mg total) by mouth daily with breakfast. 7 tablet 0   sildenafil  (VIAGRA ) 100 MG tablet Take 1 tablet (100 mg total) by mouth daily as needed for erectile dysfunction. 90 tablet 1   No current facility-administered medications for this visit.   BP (!) 142/81 (BP Location: Left Arm, Patient Position: Sitting, Cuff Size: Large)   Pulse 60   Ht 5' 11 (1.803 m)   Wt 232 lb (105.2 kg)   SpO2 93%   BMI 32.36 kg/m   PHYSICAL EXAM:  BP (!) 142/81 (BP Location: Left Arm, Patient Position: Sitting, Cuff Size: Large)   Pulse 60   Ht 5' 11 (1.803 m)   Wt 232 lb (105.2 kg)   SpO2 93%   BMI 32.36 kg/m    Salient findings:  CN II-XII intact Nose: Anterior rhinoscopy reveals septal deviation right  which is significant, with modest global mucosal edema; left > right inferior turbinate hypertrophy.  No lesions of oral cavity/oropharynx; dentition fair No respiratory distress or stridor   PROCEDURE (not today):  Prior to initiating any procedures, risks/benefits/alternatives were explained to the patient and verbal consent obtained. Diagnostic Nasal Endoscopy Pre-procedure diagnosis: Concern for chronic sinusitis with exacerbation Post-procedure diagnosis: same Indication: See pre-procedure diagnosis and physical exam above Complications: None apparent EBL: 0 mL Anesthesia: Lidocaine 4% and topical decongestant was topically sprayed in each nasal cavity  Description of Procedure:  Patient was identified. A rigid 30 degree endoscope was utilized to evaluate the sinonasal cavities, mucosa, sinus ostia and turbinates and septum.  Overall, signs of mucosal inflammation are fairly significant - bilateral globally.  Also noted are right septal deviation.  No polyps, or masses noted.  No CSF noted Right Middle meatus: modest mucosal edema middle meatus, some mucoid secretions Right SE Recess: clear Left MM: modest mucosal edema with trace purulence Left SE Recess: modest mucoid secretions   CPT CODE --  68768 - Mod 25   ASSESSMENT:  78 y.o. with:  1. Chronic pansinusitis   2. Nasal congestion   3. Hypertrophy of both inferior nasal turbinates   4. Nasal obstruction   5. Allergic rhinitis, unspecified seasonality, unspecified trigger   6. Nasal septal deviation    Noted chronic sinonasal issues with multiple infections per year. Query mycetoma left sphenoid with some dehiscence but no evidence of CSF Leak on exam or endoscopy  Doing better after abx/steroids.  Given his exacerbations, we again discussed septo/FESS in light of his likely fungal ball and SB dehiscence.   - Continue nasal rinses - Continue flonase and astelin  in interim and management per allergy. - f/u 2  months  See below regarding exact medications prescribed this encounter including dosages and route: No orders of the defined types were placed in this encounter.    Thank you for allowing me the opportunity to care for your patient. Please do not hesitate to contact me should you have any other questions.  Sincerely, Eldora Blanch, MD Otolaryngologist (ENT), [facility] Phone: [phone] Fax: [phone]  I have personally spent 30 minutes involved in face-to-face and non-face-to-face activities for this patient on the day of the visit.  Professional time spent excludes any procedures performed but includes the following activities, in addition to those noted in the documentation: preparing to see the patient (review of outside documentation and results), performing a medically appropriate examination, counseling,, documenting in the electronic health record      09/17/2024, 10:39 AM

## 2024-09-18 DIAGNOSIS — J3081 Allergic rhinitis due to animal (cat) (dog) hair and dander: Secondary | ICD-10-CM | POA: Diagnosis not present

## 2024-09-18 DIAGNOSIS — J301 Allergic rhinitis due to pollen: Secondary | ICD-10-CM | POA: Diagnosis not present

## 2024-09-18 DIAGNOSIS — J3089 Other allergic rhinitis: Secondary | ICD-10-CM | POA: Diagnosis not present

## 2024-09-26 DIAGNOSIS — J3089 Other allergic rhinitis: Secondary | ICD-10-CM | POA: Diagnosis not present

## 2024-09-26 DIAGNOSIS — J301 Allergic rhinitis due to pollen: Secondary | ICD-10-CM | POA: Diagnosis not present

## 2024-09-26 DIAGNOSIS — J3081 Allergic rhinitis due to animal (cat) (dog) hair and dander: Secondary | ICD-10-CM | POA: Diagnosis not present

## 2024-09-27 DIAGNOSIS — J301 Allergic rhinitis due to pollen: Secondary | ICD-10-CM | POA: Diagnosis not present

## 2024-10-01 DIAGNOSIS — J4530 Mild persistent asthma, uncomplicated: Secondary | ICD-10-CM | POA: Diagnosis not present

## 2024-10-01 DIAGNOSIS — J3089 Other allergic rhinitis: Secondary | ICD-10-CM | POA: Diagnosis not present

## 2024-10-01 DIAGNOSIS — J301 Allergic rhinitis due to pollen: Secondary | ICD-10-CM | POA: Diagnosis not present

## 2024-10-01 DIAGNOSIS — J3081 Allergic rhinitis due to animal (cat) (dog) hair and dander: Secondary | ICD-10-CM | POA: Diagnosis not present

## 2024-10-05 DIAGNOSIS — J3081 Allergic rhinitis due to animal (cat) (dog) hair and dander: Secondary | ICD-10-CM | POA: Diagnosis not present

## 2024-10-05 DIAGNOSIS — J301 Allergic rhinitis due to pollen: Secondary | ICD-10-CM | POA: Diagnosis not present

## 2024-10-05 DIAGNOSIS — J3089 Other allergic rhinitis: Secondary | ICD-10-CM | POA: Diagnosis not present

## 2024-10-15 DIAGNOSIS — J3081 Allergic rhinitis due to animal (cat) (dog) hair and dander: Secondary | ICD-10-CM | POA: Diagnosis not present

## 2024-10-15 DIAGNOSIS — J301 Allergic rhinitis due to pollen: Secondary | ICD-10-CM | POA: Diagnosis not present

## 2024-10-15 DIAGNOSIS — J3089 Other allergic rhinitis: Secondary | ICD-10-CM | POA: Diagnosis not present

## 2024-10-24 DIAGNOSIS — J301 Allergic rhinitis due to pollen: Secondary | ICD-10-CM | POA: Diagnosis not present

## 2024-10-24 DIAGNOSIS — J3081 Allergic rhinitis due to animal (cat) (dog) hair and dander: Secondary | ICD-10-CM | POA: Diagnosis not present

## 2024-10-24 DIAGNOSIS — J3089 Other allergic rhinitis: Secondary | ICD-10-CM | POA: Diagnosis not present

## 2024-11-05 DIAGNOSIS — J3081 Allergic rhinitis due to animal (cat) (dog) hair and dander: Secondary | ICD-10-CM | POA: Diagnosis not present

## 2024-11-05 DIAGNOSIS — J3089 Other allergic rhinitis: Secondary | ICD-10-CM | POA: Diagnosis not present

## 2024-11-05 DIAGNOSIS — J301 Allergic rhinitis due to pollen: Secondary | ICD-10-CM | POA: Diagnosis not present

## 2024-11-14 DIAGNOSIS — J301 Allergic rhinitis due to pollen: Secondary | ICD-10-CM | POA: Diagnosis not present

## 2024-11-14 DIAGNOSIS — J3081 Allergic rhinitis due to animal (cat) (dog) hair and dander: Secondary | ICD-10-CM | POA: Diagnosis not present

## 2024-11-14 DIAGNOSIS — J3089 Other allergic rhinitis: Secondary | ICD-10-CM | POA: Diagnosis not present

## 2024-11-19 ENCOUNTER — Ambulatory Visit (INDEPENDENT_AMBULATORY_CARE_PROVIDER_SITE_OTHER): Admitting: Otolaryngology

## 2024-11-21 DIAGNOSIS — J3089 Other allergic rhinitis: Secondary | ICD-10-CM | POA: Diagnosis not present

## 2024-11-21 DIAGNOSIS — J3081 Allergic rhinitis due to animal (cat) (dog) hair and dander: Secondary | ICD-10-CM | POA: Diagnosis not present

## 2024-11-21 DIAGNOSIS — J301 Allergic rhinitis due to pollen: Secondary | ICD-10-CM | POA: Diagnosis not present

## 2024-11-27 DIAGNOSIS — J3081 Allergic rhinitis due to animal (cat) (dog) hair and dander: Secondary | ICD-10-CM | POA: Diagnosis not present

## 2024-11-27 DIAGNOSIS — J3089 Other allergic rhinitis: Secondary | ICD-10-CM | POA: Diagnosis not present

## 2024-11-27 DIAGNOSIS — J301 Allergic rhinitis due to pollen: Secondary | ICD-10-CM | POA: Diagnosis not present

## 2024-12-10 ENCOUNTER — Telehealth (INDEPENDENT_AMBULATORY_CARE_PROVIDER_SITE_OTHER): Payer: Self-pay | Admitting: Otolaryngology

## 2024-12-10 DIAGNOSIS — J343 Hypertrophy of nasal turbinates: Secondary | ICD-10-CM

## 2024-12-10 DIAGNOSIS — J3489 Other specified disorders of nose and nasal sinuses: Secondary | ICD-10-CM

## 2024-12-10 DIAGNOSIS — J342 Deviated nasal septum: Secondary | ICD-10-CM

## 2024-12-10 DIAGNOSIS — R0981 Nasal congestion: Secondary | ICD-10-CM

## 2024-12-10 DIAGNOSIS — J324 Chronic pansinusitis: Secondary | ICD-10-CM

## 2024-12-10 NOTE — Telephone Encounter (Signed)
 Posted for septo/turbs/FESS; also will get pre-op CT Eldora KATHEE Blanch

## 2024-12-10 NOTE — Telephone Encounter (Signed)
-----   Message from Brittany A sent at 12/10/2024 10:02 AM EST ----- Regarding: Surgery Pt canceled his appointment with you for tomorrow, you guys were supposed to discuss a surgery. Pt state's he would like to move forward with the surgery, can you please put in an order?

## 2024-12-10 NOTE — Telephone Encounter (Signed)
 Patient called to cancel his appointment for tomorrow and said that he would just like to go ahead and schedule the sinus surgery that him and Dr Tobie had discussed. Call back is [phone]. Patient now has [facility] and I added it electronically. Patient will upload picture through mychart.

## 2024-12-11 ENCOUNTER — Ambulatory Visit (INDEPENDENT_AMBULATORY_CARE_PROVIDER_SITE_OTHER): Admitting: Otolaryngology

## 2024-12-12 ENCOUNTER — Encounter: Payer: Self-pay | Admitting: Family Medicine

## 2024-12-12 ENCOUNTER — Ambulatory Visit
Admission: RE | Admit: 2024-12-12 | Discharge: 2024-12-12 | Disposition: A | Discharge status: Home / Self Care | Source: Ambulatory Visit | Attending: Otolaryngology | Admitting: Otolaryngology

## 2024-12-12 ENCOUNTER — Ambulatory Visit: Admitting: Family Medicine

## 2024-12-12 VITALS — BP 140/80 | HR 65 | Temp 97.9°F | Ht 71.0 in | Wt 245.0 lb

## 2024-12-12 DIAGNOSIS — J342 Deviated nasal septum: Secondary | ICD-10-CM

## 2024-12-12 DIAGNOSIS — J343 Hypertrophy of nasal turbinates: Secondary | ICD-10-CM

## 2024-12-12 DIAGNOSIS — D2321 Other benign neoplasm of skin of right ear and external auricular canal: Secondary | ICD-10-CM | POA: Diagnosis not present

## 2024-12-12 DIAGNOSIS — J324 Chronic pansinusitis: Secondary | ICD-10-CM

## 2024-12-12 DIAGNOSIS — J3489 Other specified disorders of nose and nasal sinuses: Secondary | ICD-10-CM

## 2024-12-12 DIAGNOSIS — R0981 Nasal congestion: Secondary | ICD-10-CM

## 2024-12-12 NOTE — Progress Notes (Signed)
" ° °  Acute Office Visit   Subjective:  Patient ID: Edward Miles, male    DOB: [birth year], 79 y.o.   MRN: [id number]  Chief Complaint  Patient presents with   Acute Visit    Cotton stuck in right ear for two days     HPI Patient is present for an acute visit. Patients primary care provider is Darleene Shape, NP.  He reports on Monday morning he was cleaning his ears out with a Q-tip. When removing the Q-tip from his right ear, he thought the cotton got stuck in the right ear since  there was no cotton on the end. He denies any pain or complications with hearing.  ROS See HPI above      Objective:   BP (!) 140/80   Pulse 65   Temp 97.9 F (36.6 C)   Ht 5' 11 (1.803 m)   Wt 245 lb (111.1 kg)   SpO2 96%   BMI 34.17 kg/m    Physical Exam Vitals reviewed.  Constitutional:      General: He is not in acute distress.    Appearance: Normal appearance. He is obese. He is not ill-appearing, toxic-appearing or diaphoretic.  HENT:     Head: Normocephalic and atraumatic.     Right Ear: Tympanic membrane and external ear normal. No drainage.     Left Ear: Tympanic membrane, ear canal and external ear normal. There is no impacted cerumen.     Ears:     Comments: Neoplasm found of the right ear canal at the 4 o'clock.  Eyes:     General:        Right eye: No discharge.        Left eye: No discharge.     Conjunctiva/sclera: Conjunctivae normal.  Cardiovascular:     Rate and Rhythm: Normal rate.  Pulmonary:     Effort: Pulmonary effort is normal. No respiratory distress.  Musculoskeletal:        General: Normal range of motion.  Skin:    General: Skin is warm and dry.  Neurological:     General: No focal deficit present.     Mental Status: He is alert and oriented to person, place, and time. Mental status is at baseline.  Psychiatric:        Mood and Affect: Mood normal.        Behavior: Behavior normal.        Thought Content: Thought content normal.        Judgment:  Judgment normal.        Assessment & Plan:  Benign neoplasm of right ear -     Ambulatory referral to ENT  -No cotton was found in the ear canal. However, there was a raised growth area of concern on the right canal that an ENT specialist needs to further evaluate. Consulted with Darleene, NP since he is PCP. He examined ear and reports he had never seen this neoplasm in his ear canal. A referral was placed with ENT incase Dr. Tobie, his current ENT does not specialize in ears. Also, recommend to mention this to Dr. Tobie when he receives his results from the imaging he is having completed today.    Arturo Freundlich, NP "

## 2024-12-12 NOTE — Patient Instructions (Signed)
-  It was a pleasure to care for you today. -No cotton was found in the ear canal. However, there was a raised growth area of concern on the right canal that an ENT specialist needs to further evaluate. A referral was placed with ENT incase Dr. Tobie, your current ENT does not specialize in ears. Also, recommend to mention this to Dr. Tobie when you receive your results from the imaging you are completing today.

## 2025-02-01 ENCOUNTER — Encounter: Admitting: Adult Health

## 2025-04-05 ENCOUNTER — Ambulatory Visit

## 2025-04-08 ENCOUNTER — Ambulatory Visit (INDEPENDENT_AMBULATORY_CARE_PROVIDER_SITE_OTHER): Admitting: Otolaryngology

## 2025-04-15 ENCOUNTER — Ambulatory Visit (HOSPITAL_BASED_OUTPATIENT_CLINIC_OR_DEPARTMENT_OTHER): Admit: 2025-04-15

## 2025-04-15 ENCOUNTER — Encounter (HOSPITAL_BASED_OUTPATIENT_CLINIC_OR_DEPARTMENT_OTHER): Payer: Self-pay

## 2025-04-15 SURGERY — SEPTOPLASTY, NOSE, WITH NASAL TURBINATE REDUCTION
Anesthesia: General

## 2025-04-22 ENCOUNTER — Ambulatory Visit (INDEPENDENT_AMBULATORY_CARE_PROVIDER_SITE_OTHER): Admitting: Otolaryngology
